# Patient Record
Sex: Female | Born: 1995 | Race: Black or African American | Hispanic: No | Marital: Single | Smoking: Never smoker
Health system: Southern US, Community
[De-identification: ages and names within clinical notes are randomized; demographics above are authoritative.]

## PROBLEM LIST (undated history)

## (undated) DIAGNOSIS — T7840XA Allergy, unspecified, initial encounter: Secondary | ICD-10-CM

---
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
## 2016-08-03 ENCOUNTER — Emergency Department (HOSPITAL_COMMUNITY)
Admission: EM | Admit: 2016-08-03 | Discharge: 2016-08-04 | Disposition: A | Discharge status: Home / Self Care | Payer: PRIVATE HEALTH INSURANCE | Attending: Emergency Medicine | Admitting: Emergency Medicine

## 2016-08-03 ENCOUNTER — Encounter (HOSPITAL_COMMUNITY): Payer: Self-pay

## 2016-08-03 DIAGNOSIS — N390 Urinary tract infection, site not specified: Secondary | ICD-10-CM

## 2016-08-03 DIAGNOSIS — R55 Syncope and collapse: Secondary | ICD-10-CM

## 2016-08-03 LAB — URINALYSIS, ROUTINE W REFLEX MICROSCOPIC
Bilirubin Urine: NEGATIVE
GLUCOSE, UA: NEGATIVE mg/dL
Ketones, ur: NEGATIVE mg/dL
Nitrite: NEGATIVE
PROTEIN: NEGATIVE mg/dL
SPECIFIC GRAVITY, URINE: 1.008 (ref 1.005–1.030)
pH: 6 (ref 5.0–8.0)

## 2016-08-03 LAB — BASIC METABOLIC PANEL
ANION GAP: 9 (ref 5–15)
BUN: 7 mg/dL (ref 6–20)
CHLORIDE: 106 mmol/L (ref 101–111)
CO2: 22 mmol/L (ref 22–32)
Calcium: 9.8 mg/dL (ref 8.9–10.3)
Creatinine, Ser: 0.75 mg/dL (ref 0.44–1.00)
GFR calc non Af Amer: >60 mL/min (ref >60)
Glucose, Bld: 89 mg/dL (ref 65–99)
POTASSIUM: 4.1 mmol/L (ref 3.5–5.1)
SODIUM: 137 mmol/L (ref 135–145)

## 2016-08-03 LAB — CBC
HEMATOCRIT: 44 % (ref 36.0–46.0)
HEMOGLOBIN: 15 g/dL (ref 12.0–15.0)
MCH: 30.5 pg (ref 26.0–34.0)
MCHC: 34.1 g/dL (ref 30.0–36.0)
MCV: 89.6 fL (ref 78.0–100.0)
Platelets: 274 10*3/uL (ref 150–400)
RBC: 4.91 MIL/uL (ref 3.87–5.11)
RDW: 12.4 % (ref 11.5–15.5)
WBC: 10 10*3/uL (ref 4.0–10.5)

## 2016-08-03 LAB — URINE MICROSCOPIC-ADD ON

## 2016-08-03 LAB — POC URINE PREG, ED: PREG TEST UR: NEGATIVE

## 2016-08-03 NOTE — ED Triage Notes (Signed)
Pt states she was standing at work and had acute onset of abdominal pain; pt states she walked to the back and everything went black; Pt states she never passed completely; Pt denies current abdominal pain; pt states she still has appendix; Pt denies any recent illness

## 2016-08-03 NOTE — ED Provider Notes (Signed)
[facility]-EMERGENCY DEPT Provider Note   CSN: [id number] Arrival date & time: 08/03/16  4098  First Provider Contact:  None       History   Chief Complaint Chief Complaint  Patient presents with  . Near Syncope    HPI Katrina Maldonado is a 20 y.o. female.  HPI   20 year old female presents for evaluation of a near syncope. Pt report acute onset lower abd pain and then felt lightheaded and have to sit down.  abd pain has since resolved but pt still having a "swimmy headed" sensation.  This has never happened to her before. Denies cp, sob, heart palpitation.  Denies any changes in medication, recent illness, fever, chills, headache, diplopia, neck pain, cp, sob, back pain, focal numbness or weakness.  No n/v/d, or changes in her bowel/bladder habits.  Report having working more than usual, at least daily and admits she haven't been drinking enough fluid.  Patient also reported being diagnosed with having chlamydia infection 2 weeks ago. She was giving medication in the hospital once this treatment but states that her symptoms has not fully resolved. She denies vaginal bleeding or vaginal discharge and no pain with sexual activities.      History reviewed. No pertinent past medical history.  There are no active problems to display for this patient.   History reviewed. No pertinent surgical history.  OB History    No data available       Home Medications    Prior to Admission medications   Medication Sig Start Date End Date Taking? Authorizing Provider  medroxyPROGESTERone (DEPO-PROVERA) 150 MG/ML injection Inject 150 mg into the muscle every 3 (three) months. Last injection June 14, 2016   Yes Historical Provider, MD    Family History No family history on file.  Social History Social History  Substance Use Topics  . Smoking status: Never Smoker  . Smokeless tobacco: Never Used  . Alcohol use No     Allergies   Review of patient's allergies indicates no known  allergies.   Review of Systems Review of Systems  All other systems reviewed and are negative.    Physical Exam Updated Vital Signs BP 130/77   Pulse 89   Temp 97.7 F (36.5 C) (Oral)   Resp 16   Ht 5' 5.5" (1.664 m)   Wt 61.2 kg   LMP  (LMP Unknown) Comment: pt states she is on BC and does not have mentral  SpO2 100%   BMI 22.12 kg/m   Physical Exam  Constitutional: She is oriented to person, place, and time. She appears well-developed and well-nourished. No distress.  HENT:  Head: Atraumatic.  Eyes: Conjunctivae are normal.  Neck: Neck supple.  No nuchal rigidity  Cardiovascular: Normal rate and regular rhythm.  Exam reveals no gallop and no friction rub.   No murmur heard. Pulmonary/Chest: Effort normal and breath sounds normal.  Abdominal: Soft. Bowel sounds are normal. There is no tenderness. There is no rebound and no guarding.  Musculoskeletal: Normal range of motion.  Neurological: She is alert and oriented to person, place, and time. She displays normal reflexes. No cranial nerve deficit. She exhibits normal muscle tone. Coordination normal.  Skin: Capillary refill takes less than 2 seconds. No rash noted.  Psychiatric: She has a normal mood and affect.  Nursing note and vitals reviewed.    ED Treatments / Results  Labs (all labs ordered are listed, but only abnormal results are displayed) Labs Reviewed  URINALYSIS,  ROUTINE W REFLEX MICROSCOPIC (NOT AT [facility]) - Abnormal; Notable for the following:       Result Value   Hgb urine dipstick SMALL (*)    Leukocytes, UA LARGE (*)    All other components within normal limits  URINE MICROSCOPIC-ADD ON - Abnormal; Notable for the following:    Squamous Epithelial / LPF 6-30 (*)    Bacteria, UA RARE (*)    All other components within normal limits  BASIC METABOLIC PANEL  CBC  CBG MONITORING, ED  POC URINE PREG, ED    EKG  EKG Interpretation None       Radiology No results  found.  Procedures Procedures (including critical care time)  Medications Ordered in ED Medications - No data to display   Initial Impression / Assessment and Plan / ED Course  I have reviewed the triage vital signs and the nursing notes.  Pertinent labs & imaging results that were available during my care of the patient were reviewed by me and considered in my medical decision making (see chart for details).  Clinical Course    BP 116/82   Pulse 89   Temp 97.7 F (36.5 C) (Oral)   Resp 16   Ht 5' 5.5" (1.664 m)   Wt 61.2 kg   LMP  (LMP Unknown) Comment: pt states she is on BC and does not have mentral  SpO2 99%   BMI 22.12 kg/m    Final Clinical Impressions(s) / ED Diagnoses   Final diagnoses:  None    New Prescriptions New Prescriptions   DOXYCYCLINE (VIBRAMYCIN) 100 MG CAPSULE    Take 1 capsule (100 mg total) by mouth 2 (two) times daily. One po bid x 7 days   11:44 PM Patient here for a near syncopal episode earlier today. Her symptoms mostly resolved. He reports abdominal discomfort earlier but that has since resolved. No reproducible abdominal pain on exam. Her labs remarkable for urine with gradual urine tract infection however patient denies any urinary symptoms. She did mention been diagnosed with chlamydia infection several weeks prior and was treated once but the symptoms still persist. She does not want another pelvic examination. Since the pregnancy test is negative, plan to discharge with doxycycline to cover for potentially undertreated Chlamydia.    Fayrene Helper, PA-C 08/04/16 0005    Derwood Kaplan, MD 08/04/16 0623

## 2016-08-04 MED ORDER — DOXYCYCLINE HYCLATE 100 MG PO CAPS: 100.0000 mg | ORAL_CAPSULE | Freq: Two times a day (BID) | ORAL | 0 refills | Status: DC

## 2016-08-04 NOTE — Discharge Instructions (Signed)
Please stay hydrated to decrease risk of passing out.  Take antibiotic as prescribed for the full duration as treatment for suspect under treated chlamydial infection.  Return if you have any concerns.

## 2016-10-30 ENCOUNTER — Encounter (HOSPITAL_COMMUNITY): Payer: Self-pay

## 2016-10-30 ENCOUNTER — Emergency Department (HOSPITAL_COMMUNITY)
Admission: EM | Admit: 2016-10-30 | Discharge: 2016-10-30 | Disposition: A | Discharge status: Home / Self Care | Payer: 59 | Attending: Emergency Medicine | Admitting: Emergency Medicine

## 2016-10-30 DIAGNOSIS — B379 Candidiasis, unspecified: Secondary | ICD-10-CM

## 2016-10-30 DIAGNOSIS — N390 Urinary tract infection, site not specified: Secondary | ICD-10-CM

## 2016-10-30 DIAGNOSIS — R319 Hematuria, unspecified: Secondary | ICD-10-CM

## 2016-10-30 DIAGNOSIS — N898 Other specified noninflammatory disorders of vagina: Secondary | ICD-10-CM | POA: Diagnosis present

## 2016-10-30 LAB — URINALYSIS, ROUTINE W REFLEX MICROSCOPIC
Bilirubin Urine: NEGATIVE
Glucose, UA: NEGATIVE mg/dL
HGB URINE DIPSTICK: NEGATIVE
Ketones, ur: NEGATIVE mg/dL
Nitrite: NEGATIVE
Protein, ur: NEGATIVE mg/dL
SPECIFIC GRAVITY, URINE: 1.026 (ref 1.005–1.030)
pH: 6 (ref 5.0–8.0)

## 2016-10-30 LAB — WET PREP, GENITAL
Clue Cells Wet Prep HPF POC: NONE SEEN
Sperm: NONE SEEN
TRICH WET PREP: NONE SEEN

## 2016-10-30 LAB — URINE MICROSCOPIC-ADD ON

## 2016-10-30 LAB — PREGNANCY, URINE: PREG TEST UR: NEGATIVE

## 2016-10-30 MED ORDER — FLUCONAZOLE 150 MG PO TABS: ORAL_TABLET | ORAL | 0 refills | Status: DC

## 2016-10-30 MED ORDER — CEFTRIAXONE SODIUM 250 MG IJ SOLR
250.0000 mg | Freq: Once | INTRAMUSCULAR | Status: AC
  Administered 2016-10-30: 250 mg via INTRAMUSCULAR
  Filled 2016-10-30: qty 250

## 2016-10-30 MED ORDER — AZITHROMYCIN 250 MG PO TABS
1000.0000 mg | ORAL_TABLET | Freq: Once | ORAL | Status: AC
  Administered 2016-10-30: 1000 mg via ORAL
  Filled 2016-10-30: qty 4

## 2016-10-30 MED ORDER — CEPHALEXIN 500 MG PO CAPS: 500.0000 mg | ORAL_CAPSULE | Freq: Two times a day (BID) | ORAL | 0 refills | Status: DC

## 2016-10-30 MED ORDER — LIDOCAINE HCL 1 % IJ SOLN
INTRAMUSCULAR | Status: AC
  Administered 2016-10-30: 20 mL
  Filled 2016-10-30: qty 20

## 2016-10-30 NOTE — ED Provider Notes (Signed)
[facility]-EMERGENCY DEPT Provider Note   CSN: [id number] Arrival date & time: 10/30/16  1101     History   Chief Complaint Chief Complaint  Patient presents with  . Vaginal Itching    HPI Katrina Maldonado is a 20 y.o. female.  The history is provided by the patient and medical records.     20 year old female here with vaginal discharge. States it has seemed like a "powdery white substance".  Denies any irregular vaginal bleeding.  No abdominal or pelvic pain.  She has had some vaginal itching as well.  No urinary symptoms.  Does report new sexual partner and does have some concern for STD.  States she was diagnosed with chlamydia a few months ago but was treated for this.  No fever, chills.  VSS.  History reviewed. No pertinent past medical history.  There are no active problems to display for this patient.   History reviewed. No pertinent surgical history.  OB History    No data available       Home Medications    Prior to Admission medications   Medication Sig Start Date End Date Taking? Authorizing Provider  medroxyPROGESTERone (DEPO-PROVERA) 150 MG/ML injection Inject 150 mg into the muscle every 3 (three) months. Last injection June 14, 2016   Yes Historical Provider, MD  Multiple Vitamin (MULTIVITAMIN WITH MINERALS) TABS tablet Take 1 tablet by mouth daily.   Yes Historical Provider, MD    Family History No family history on file.  Social History Social History  Substance Use Topics  . Smoking status: Never Smoker  . Smokeless tobacco: Never Used  . Alcohol use No     Allergies   Review of patient's allergies indicates no known allergies.   Review of Systems Review of Systems  Genitourinary: Positive for vaginal discharge.  All other systems reviewed and are negative.    Physical Exam Updated Vital Signs BP 136/80 (BP Location: Right Arm)   Pulse 78   Temp 99 F (37.2 C) (Oral)   Resp 18   Ht 5\' 6"  (1.676 m)   LMP 09/30/2016 (Approximate)    SpO2 98%   Physical Exam  Constitutional: She is oriented to person, place, and time. She appears well-developed and well-nourished.  HENT:  Head: Normocephalic and atraumatic.  Mouth/Throat: Oropharynx is clear and moist.  Eyes: Conjunctivae and EOM are normal. Pupils are equal, round, and reactive to light.  Neck: Normal range of motion.  Cardiovascular: Normal rate, regular rhythm and normal heart sounds.   Pulmonary/Chest: Effort normal and breath sounds normal.  Abdominal: Soft. Bowel sounds are normal.  Genitourinary:  Genitourinary Comments: Exam chaperoned by RN normal female external genitalia without visible lesions or rashes, no appreciable discharge noted; no bleeding or FB; no adnexal or CMT  Musculoskeletal: Normal range of motion.  Neurological: She is alert and oriented to person, place, and time.  Skin: Skin is warm and dry.  Psychiatric: She has a normal mood and affect.  Nursing note and vitals reviewed.    ED Treatments / Results  Labs (all labs ordered are listed, but only abnormal results are displayed) Labs Reviewed - No data to display  EKG  EKG Interpretation None       Radiology No results found.  Procedures Procedures (including critical care time)  Medications Ordered in ED Medications - No data to display   Initial Impression / Assessment and Plan / ED Course  I have reviewed the triage vital signs and the nursing notes.  Pertinent labs & imaging results that were available during my care of the patient were reviewed by me and considered in my medical decision making (see chart for details).  Clinical Course   20 year old female here with vaginal discharge and itching. Does report new sexual partner with concern for STD. She is afebrile and nontoxic. Abdominal exam is benign. Pelvic exam with no appreciable discharge. No adnexal or cervical motion tenderness. Wet prep does reveal yeast and a few PVCs. UA does appear infectious. Given  her concern for STD, she was treated empirically with Rocephin and azithromycin here in ED.  Gc/Chl. HIV, and RPR pending. Will discharge home with Keflex and Diflucan. She was encouraged to follow-up with women's clinic for any ongoing oncologic issues.  Discussed plan with patient, she acknowledged understanding and agreed with plan of care.  Return precautions given for new or worsening symptoms.  Final Clinical Impressions(s) / ED Diagnoses   Final diagnoses:  Urinary tract infection with hematuria, site unspecified  Yeast infection    New Prescriptions New Prescriptions   CEPHALEXIN (KEFLEX) 500 MG CAPSULE    Take 1 capsule (500 mg total) by mouth 2 (two) times daily.   FLUCONAZOLE (DIFLUCAN) 150 MG TABLET    Take one pill now.  Repeat in 72 hours if needed for vaginal itching.     Garlon HatchetLisa M Melville Engen, PA-C 10/30/16 1711    Jacalyn LefevreJulie Haviland, MD 10/30/16 [phone]

## 2016-10-30 NOTE — ED Triage Notes (Signed)
Pt presents with c/o vaginal itching. Pt reports that she has also noticed a bump on her vagina and she has been exposed to STD's.

## 2016-10-30 NOTE — Discharge Instructions (Signed)
Take the prescribed medication as directed. Follow-up with women's clinic if you continue having any vaginal issues. Return to the ED for new or worsening symptoms.

## 2016-10-31 LAB — HIV ANTIBODY (ROUTINE TESTING W REFLEX): HIV Screen 4th Generation wRfx: NONREACTIVE

## 2016-10-31 LAB — GC/CHLAMYDIA PROBE AMP (~~LOC~~) NOT AT ARMC
CHLAMYDIA, DNA PROBE: NEGATIVE
Neisseria Gonorrhea: NEGATIVE

## 2016-10-31 LAB — RPR: RPR: NONREACTIVE

## 2017-09-20 ENCOUNTER — Encounter (HOSPITAL_COMMUNITY): Payer: Self-pay | Admitting: Emergency Medicine

## 2017-09-20 DIAGNOSIS — R1031 Right lower quadrant pain: Secondary | ICD-10-CM | POA: Insufficient documentation

## 2017-09-20 NOTE — ED Triage Notes (Signed)
Pt comes in with complaints of abdominal pain that began today. Pt eating and drinking regularly without any issues. Denies vomiting and diarrhea. Does not have periods due to having Nexplanon implant.

## 2017-09-21 ENCOUNTER — Emergency Department (HOSPITAL_COMMUNITY): Payer: 59

## 2017-09-21 ENCOUNTER — Encounter (HOSPITAL_COMMUNITY): Payer: Self-pay | Admitting: Radiology

## 2017-09-21 ENCOUNTER — Emergency Department (HOSPITAL_COMMUNITY)
Admission: EM | Admit: 2017-09-21 | Discharge: 2017-09-21 | Disposition: A | Discharge status: Home / Self Care | Payer: 59 | Attending: Emergency Medicine | Admitting: Emergency Medicine

## 2017-09-21 DIAGNOSIS — R1031 Right lower quadrant pain: Secondary | ICD-10-CM

## 2017-09-21 LAB — URINALYSIS, ROUTINE W REFLEX MICROSCOPIC
BILIRUBIN URINE: NEGATIVE
Glucose, UA: NEGATIVE mg/dL
KETONES UR: 20 mg/dL — AB
NITRITE: NEGATIVE
PH: 5 (ref 5.0–8.0)
PROTEIN: NEGATIVE mg/dL
SPECIFIC GRAVITY, URINE: 1.043 — AB (ref 1.005–1.030)

## 2017-09-21 LAB — CBC
HEMATOCRIT: 43.4 % (ref 36.0–46.0)
Hemoglobin: 15 g/dL (ref 12.0–15.0)
MCH: 31.6 pg (ref 26.0–34.0)
MCHC: 34.6 g/dL (ref 30.0–36.0)
MCV: 91.4 fL (ref 78.0–100.0)
PLATELETS: 290 10*3/uL (ref 150–400)
RBC: 4.75 MIL/uL (ref 3.87–5.11)
RDW: 12.4 % (ref 11.5–15.5)
WBC: 9.8 10*3/uL (ref 4.0–10.5)

## 2017-09-21 LAB — COMPREHENSIVE METABOLIC PANEL
ALT: 14 U/L (ref 14–54)
AST: 20 U/L (ref 15–41)
Albumin: 4.9 g/dL (ref 3.5–5.0)
Alkaline Phosphatase: 75 U/L (ref 38–126)
Anion gap: 10 (ref 5–15)
BILIRUBIN TOTAL: 0.9 mg/dL (ref 0.3–1.2)
BUN: 9 mg/dL (ref 6–20)
CHLORIDE: 105 mmol/L (ref 101–111)
CO2: 26 mmol/L (ref 22–32)
Calcium: 9.5 mg/dL (ref 8.9–10.3)
Creatinine, Ser: 0.85 mg/dL (ref 0.44–1.00)
Glucose, Bld: 90 mg/dL (ref 65–99)
POTASSIUM: 3.7 mmol/L (ref 3.5–5.1)
Sodium: 141 mmol/L (ref 135–145)
TOTAL PROTEIN: 8 g/dL (ref 6.5–8.1)

## 2017-09-21 LAB — I-STAT BETA HCG BLOOD, ED (MC, WL, AP ONLY): I-stat hCG, quantitative: <5 m[IU]/mL (ref <5)

## 2017-09-21 LAB — LIPASE, BLOOD: LIPASE: 24 U/L (ref 11–51)

## 2017-09-21 MED ORDER — IOPAMIDOL (ISOVUE-300) INJECTION 61%
100.0000 mL | Freq: Once | INTRAVENOUS | Status: AC | PRN
  Administered 2017-09-21: 100 mL via INTRAVENOUS

## 2017-09-21 MED ORDER — CEPHALEXIN 500 MG PO CAPS: 500.0000 mg | ORAL_CAPSULE | Freq: Four times a day (QID) | ORAL | 0 refills | Status: DC

## 2017-09-21 MED ORDER — IOPAMIDOL (ISOVUE-300) INJECTION 61%
INTRAVENOUS | Status: AC
  Filled 2017-09-21: qty 100

## 2017-09-21 NOTE — ED Provider Notes (Signed)
Pt with additional questions after previous provider spoke with her and she was discharged. Questioning why bacteria noted on UA, but not treated. Discussed that squamous cells also noted and that cannot discriminate bacteria from UTI versus contamination on testing, but that contamination probably more likely. She denies specific urinary complaints to me as well. I recommended obtaining a urine culture and treating pending those results. She is pushing for abx. It's plausible her pain could be from UTI and there isn't a clear alternative explanation. I don't think abx are completely unreasonable. Script for keflex provided.   Results for orders placed or performed during the hospital encounter of 09/21/17  Lipase, blood  Result Value Ref Range   Lipase 24 11 - 51 U/L  Comprehensive metabolic panel  Result Value Ref Range   Sodium 141 135 - 145 mmol/L   Potassium 3.7 3.5 - 5.1 mmol/L   Chloride 105 101 - 111 mmol/L   CO2 26 22 - 32 mmol/L   Glucose, Bld 90 65 - 99 mg/dL   BUN 9 6 - 20 mg/dL   Creatinine, Ser 1.61 0.44 - 1.00 mg/dL   Calcium 9.5 8.9 - 09.6 mg/dL   Total Protein 8.0 6.5 - 8.1 g/dL   Albumin 4.9 3.5 - 5.0 g/dL   AST 20 15 - 41 U/L   ALT 14 14 - 54 U/L   Alkaline Phosphatase 75 38 - 126 U/L   Total Bilirubin 0.9 0.3 - 1.2 mg/dL   GFR calc non Af Amer >60 >60 mL/min   GFR calc Af Amer >60 >60 mL/min   Anion gap 10 5 - 15  CBC  Result Value Ref Range   WBC 9.8 4.0 - 10.5 K/uL   RBC 4.75 3.87 - 5.11 MIL/uL   Hemoglobin 15.0 12.0 - 15.0 g/dL   HCT 04.5 40.9 - 81.1 %   MCV 91.4 78.0 - 100.0 fL   MCH 31.6 26.0 - 34.0 pg   MCHC 34.6 30.0 - 36.0 g/dL   RDW 91.4 78.2 - 95.6 %   Platelets 290 150 - 400 K/uL  Urinalysis, Routine w reflex microscopic  Result Value Ref Range   Color, Urine YELLOW YELLOW   APPearance HAZY (A) CLEAR   Specific Gravity, Urine 1.043 (H) 1.005 - 1.030   pH 5.0 5.0 - 8.0   Glucose, UA NEGATIVE NEGATIVE mg/dL   Hgb urine dipstick SMALL (A)  NEGATIVE   Bilirubin Urine NEGATIVE NEGATIVE   Ketones, ur 20 (A) NEGATIVE mg/dL   Protein, ur NEGATIVE NEGATIVE mg/dL   Nitrite NEGATIVE NEGATIVE   Leukocytes, UA LARGE (A) NEGATIVE   RBC / HPF 0-5 0 - 5 RBC/hpf   WBC, UA 6-30 0 - 5 WBC/hpf   Bacteria, UA RARE (A) NONE SEEN   Squamous Epithelial / LPF 6-30 (A) NONE SEEN   Mucus PRESENT   I-Stat beta hCG blood, ED  Result Value Ref Range   I-stat hCG, quantitative <5.0 <5 mIU/mL   Comment 3           Ct Abdomen Pelvis W Contrast  Result Date: 09/21/2017 CLINICAL DATA:  Abdominal pain beginning today. EXAM: CT ABDOMEN AND PELVIS WITH CONTRAST TECHNIQUE: Multidetector CT imaging of the abdomen and pelvis was performed using the standard protocol following bolus administration of intravenous contrast. CONTRAST:  ISOVUE-300 IOPAMIDOL (ISOVUE-300) INJECTION 61% COMPARISON:  None. FINDINGS: Lower chest: Lung bases are clear. Hepatobiliary: Mild diffuse fatty infiltration of the liver. No focal liver lesions. Gallbladder and bile  ducts are unremarkable. Pancreas: Unremarkable. No pancreatic ductal dilatation or surrounding inflammatory changes. Spleen: Normal in size without focal abnormality. Adrenals/Urinary Tract: Adrenal glands are unremarkable. Kidneys are normal, without renal calculi, focal lesion, or hydronephrosis. Bladder is unremarkable. Stomach/Bowel: Stomach is decompressed. Now rotation of the small bowel with ligament of Treitz to the right of midline and proximal small bowel in the right mid abdomen. No evidence of obstruction or wall thickening. Positioning of the colon is normal. Colon is mostly decompressed with scattered gas and stool present. No colonic wall thickening or inflammation. Appendix is normal. Vascular/Lymphatic: No significant vascular findings are present. No enlarged abdominal or pelvic lymph nodes. Reproductive: Uterus and bilateral adnexa are unremarkable. Other: No free air or free fluid in the abdomen.  Abdominal wall musculature appears intact. Musculoskeletal: No acute or significant osseous findings. IMPRESSION: 1. 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 No evidence of obstruction. Normal appearance of the colon. 2. Mild diffuse fatty infiltration of the liver. Electronically Signed   By: Burman Nieves M.D.   On: 09/21/2017 06:41      Raeford Razor, MD 09/21/17 [phone]

## 2017-09-21 NOTE — ED Provider Notes (Signed)
[facility]-EMERGENCY DEPT Provider Note   CSN: [id number] Arrival date & time: 09/20/17  2248     History   Chief Complaint Chief Complaint  Patient presents with  . Abdominal Pain    HPI Katrina Maldonado is a 21 y.o. female.  Patient c/o rlq abd pain in the past day. Pain constant, dull, moderate, non radiating. Denies dysuria. No vaginal bleeding or discharge, states had some vaginal bleeding 2-3 days ago. Denies back or flank pain. Denies hx ovarian cysts, or endometriosis. No prior abd surgery/no hx appendicitis.  Nausea. No vomiting. No diarrhea, having normal bms.    The history is provided by the patient.  Abdominal Pain   Pertinent negatives include fever, vomiting, dysuria and headaches.    History reviewed. No pertinent past medical history.  There are no active problems to display for this patient.   History reviewed. No pertinent surgical history.  OB History    No data available       Home Medications    Prior to Admission medications   Not on File    Family History No family history on file.  Social History Social History  Substance Use Topics  . Smoking status: Never Smoker  . Smokeless tobacco: Never Used  . Alcohol use No     Allergies   Patient has no known allergies.   Review of Systems Review of Systems  Constitutional: Negative for fever.  HENT: Negative for sore throat.   Eyes: Negative for redness.  Respiratory: Negative for cough and shortness of breath.   Cardiovascular: Negative for chest pain.  Gastrointestinal: Positive for abdominal pain. Negative for vomiting.  Genitourinary: Negative for dysuria, flank pain, vaginal bleeding and vaginal discharge.  Musculoskeletal: Negative for back pain.  Skin: Negative for rash.  Neurological: Negative for headaches.  Hematological: Does not bruise/bleed easily.  Psychiatric/Behavioral: Negative for confusion.     Physical Exam Updated Vital Signs BP (!) 154/99 (BP Location:  Right Arm)   Pulse 86   Temp 98.4 F (36.9 C) (Oral)   Resp 18   Wt 62.6 kg (138 lb)   SpO2 97%   BMI 22.27 kg/m   Physical Exam  Constitutional: She appears well-developed and well-nourished. No distress.  HENT:  Head: Atraumatic.  Eyes: Conjunctivae are normal. No scleral icterus.  Neck: Neck supple. No tracheal deviation present.  Cardiovascular: Normal rate.   Pulmonary/Chest: Effort normal and breath sounds normal. No respiratory distress.  Abdominal: Soft. Normal appearance and bowel sounds are normal. She exhibits no distension. There is tenderness.  RLQ tenderness  Genitourinary:  Genitourinary Comments: No cva tenderness  Musculoskeletal: She exhibits no edema.  Neurological: She is alert.  Skin: Skin is warm and dry. No rash noted. She is not diaphoretic.  Psychiatric: She has a normal mood and affect.  Nursing note and vitals reviewed.    ED Treatments / Results  Labs (all labs ordered are listed, but only abnormal results are displayed) Results for orders placed or performed during the hospital encounter of 09/21/17  Lipase, blood  Result Value Ref Range   Lipase 24 11 - 51 U/L  Comprehensive metabolic panel  Result Value Ref Range   Sodium 141 135 - 145 mmol/L   Potassium 3.7 3.5 - 5.1 mmol/L   Chloride 105 101 - 111 mmol/L   CO2 26 22 - 32 mmol/L   Glucose, Bld 90 65 - 99 mg/dL   BUN 9 6 - 20 mg/dL   Creatinine, Ser 4.09 0.44 -  1.00 mg/dL   Calcium 9.5 8.9 - 45.4 mg/dL   Total Protein 8.0 6.5 - 8.1 g/dL   Albumin 4.9 3.5 - 5.0 g/dL   AST 20 15 - 41 U/L   ALT 14 14 - 54 U/L   Alkaline Phosphatase 75 38 - 126 U/L   Total Bilirubin 0.9 0.3 - 1.2 mg/dL   GFR calc non Af Amer >60 >60 mL/min   GFR calc Af Amer >60 >60 mL/min   Anion gap 10 5 - 15  CBC  Result Value Ref Range   WBC 9.8 4.0 - 10.5 K/uL   RBC 4.75 3.87 - 5.11 MIL/uL   Hemoglobin 15.0 12.0 - 15.0 g/dL   HCT 09.8 11.9 - 14.7 %   MCV 91.4 78.0 - 100.0 fL   MCH 31.6 26.0 - 34.0 pg    MCHC 34.6 30.0 - 36.0 g/dL   RDW 82.9 56.2 - 13.0 %   Platelets 290 150 - 400 K/uL  I-Stat beta hCG blood, ED  Result Value Ref Range   I-stat hCG, quantitative <5.0 <5 mIU/mL   Comment 3            EKG  EKG Interpretation None       Radiology Ct Abdomen Pelvis W Contrast  Result Date: 09/21/2017 CLINICAL DATA:  Abdominal pain beginning today. EXAM: CT ABDOMEN AND PELVIS WITH CONTRAST TECHNIQUE: Multidetector CT imaging of the abdomen and pelvis was performed using the standard protocol following bolus administration of intravenous contrast. CONTRAST:  ISOVUE-300 IOPAMIDOL (ISOVUE-300) INJECTION 61% COMPARISON:  None. FINDINGS: Lower chest: Lung bases are clear. Hepatobiliary: Mild diffuse fatty infiltration of the liver. No focal liver lesions. Gallbladder and bile ducts are unremarkable. Pancreas: Unremarkable. No pancreatic ductal dilatation or surrounding inflammatory changes. Spleen: Normal in size without focal abnormality. Adrenals/Urinary Tract: Adrenal glands are unremarkable. Kidneys are normal, without renal calculi, focal lesion, or hydronephrosis. Bladder is unremarkable. Stomach/Bowel: Stomach is decompressed. Now rotation of the small bowel with ligament of Treitz to the right of midline and proximal small bowel in the right mid abdomen. No evidence of obstruction or wall thickening. Positioning of the colon is normal. Colon is mostly decompressed with scattered gas and stool present. No colonic wall thickening or inflammation. Appendix is normal. Vascular/Lymphatic: No significant vascular findings are present. No enlarged abdominal or pelvic lymph nodes. Reproductive: Uterus and bilateral adnexa are unremarkable. Other: No free air or free fluid in the abdomen. Abdominal wall musculature appears intact. Musculoskeletal: No acute or significant osseous findings. IMPRESSION: 1. 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 No evidence of  obstruction. Normal appearance of the colon. 2. Mild diffuse fatty infiltration of the liver. Electronically Signed   By: Burman Nieves M.D.   On: 09/21/2017 06:41    Procedures Procedures (including critical care time)  Medications Ordered in ED Medications - No data to display   Initial Impression / Assessment and Plan / ED Course  I have reviewed the triage vital signs and the nursing notes.  Pertinent labs & imaging results that were available during my care of the patient were reviewed by me and considered in my medical decision making (see chart for details).  Iv ns. Labs.   Reviewed nursing notes and prior charts for additional history.   rlq tenderness, will get ct.   Recheck pt, comfortable appearing, nad.   No vomiting. Afeb.    Discussed incidental finding malrotation w pt, will refer to  gen surg f/u as outpt.   Return precautions provided.     Final Clinical Impressions(s) / ED Diagnoses   Final diagnoses:  None    New Prescriptions New Prescriptions   No medications on file     Cathren Laine, MD 09/21/17 [phone]

## 2017-09-21 NOTE — Discharge Instructions (Addendum)
It was our pleasure to provide your ER care today - we hope that you feel better.  Your CT scan was read as showing 'malrotation of the small intestine'.  For this, follow up with general surgeon in their office in the 1-2 weeks - call office this Monday to arrange appointment.  Return to ER right away if worse, new symptoms, high fevers, worsening or severe pain, persistent vomiting, other concern.

## 2017-09-23 LAB — URINE CULTURE

## 2017-10-10 ENCOUNTER — Other Ambulatory Visit: Payer: Self-pay | Admitting: Surgery

## 2017-10-10 DIAGNOSIS — Q433 Congenital malformations of intestinal fixation: Secondary | ICD-10-CM

## 2017-10-17 ENCOUNTER — Ambulatory Visit
Admission: RE | Admit: 2017-10-17 | Discharge: 2017-10-17 | Disposition: A | Discharge status: Home / Self Care | Payer: 59 | Source: Ambulatory Visit | Attending: Surgery | Admitting: Surgery

## 2017-10-17 DIAGNOSIS — Q433 Congenital malformations of intestinal fixation: Secondary | ICD-10-CM

## 2017-11-27 ENCOUNTER — Other Ambulatory Visit: Payer: Self-pay

## 2017-11-27 ENCOUNTER — Encounter (HOSPITAL_BASED_OUTPATIENT_CLINIC_OR_DEPARTMENT_OTHER): Payer: Self-pay | Admitting: *Deleted

## 2017-11-27 ENCOUNTER — Emergency Department (HOSPITAL_BASED_OUTPATIENT_CLINIC_OR_DEPARTMENT_OTHER)
Admission: EM | Admit: 2017-11-27 | Discharge: 2017-11-27 | Disposition: A | Discharge status: Home / Self Care | Payer: 59 | Attending: Emergency Medicine | Admitting: Emergency Medicine

## 2017-11-27 DIAGNOSIS — R51 Headache: Secondary | ICD-10-CM | POA: Diagnosis not present

## 2017-11-27 DIAGNOSIS — R0981 Nasal congestion: Secondary | ICD-10-CM | POA: Insufficient documentation

## 2017-11-27 MED ORDER — TRIAMCINOLONE ACETONIDE 55 MCG/ACT NA AERO: 2.0000 | INHALATION_SPRAY | Freq: Every day | NASAL | 0 refills | Status: DC

## 2017-11-27 MED ORDER — CETIRIZINE HCL 10 MG PO TABS: 10.0000 mg | ORAL_TABLET | Freq: Every day | ORAL | 0 refills | Status: DC

## 2017-11-27 NOTE — ED Notes (Signed)
ED Provider at bedside. 

## 2017-11-27 NOTE — ED Triage Notes (Signed)
Pt c/o URi symptoms  x 3 weeks  

## 2017-11-27 NOTE — ED Provider Notes (Signed)
[facility] EMERGENCY DEPARTMENT Provider Note   CSN: [id number] Arrival date & time: 11/27/17  1651     History   Chief Complaint Chief Complaint  Patient presents with  . Nasal Congestion    HPI Katrina Maldonado is a 21 y.o. female.  HPI  21 year old female presents with nasal congestion.  This is been ongoing for 3 weeks.  It started off as a scratchy throat and sore throat but this has since resolved.  She has not had any fevers or trouble breathing.  She did have a cough momentarily but that has been resolved.  On and off headaches.  She is tried Benadryl, DayQuil and NyQuil with no relief.  She feels like her sinuses are stuffed up.  She denies any sinus or facial pain.  She denies any rhinorrhea but has had some yellow nasal congestion.  No vomiting.  History reviewed. No pertinent past medical history.  There are no active problems to display for this patient.   History reviewed. No pertinent surgical history.  OB History    No data available       Home Medications    Prior to Admission medications   Medication Sig Start Date End Date Taking? Authorizing Provider  cetirizine (ZYRTEC ALLERGY) 10 MG tablet Take 1 tablet (10 mg total) by mouth daily. 11/27/17   Pricilla LovelessGoldston, Hari Casaus, MD  triamcinolone (NASACORT) 55 MCG/ACT AERO nasal inhaler Place 2 sprays into the nose daily. 11/27/17   Pricilla LovelessGoldston, Makinley Muscato, MD    Family History History reviewed. No pertinent family history.  Social History Social History   Tobacco Use  . Smoking status: Never Smoker  . Smokeless tobacco: Never Used  Substance Use Topics  . Alcohol use: No  . Drug use: No     Allergies   Patient has no known allergies.   Review of Systems Review of Systems  HENT: Positive for congestion and sore throat. Negative for ear pain, rhinorrhea and sinus pain.   Respiratory: Negative for cough and shortness of breath.   Gastrointestinal: Negative for vomiting.  All other systems  reviewed and are negative.    Physical Exam Updated Vital Signs BP (!) 144/89 (BP Location: Left Arm)   Pulse 93   Temp 98.1 F (36.7 C) (Oral)   Resp 16   Ht 5\' 6"  (1.676 m)   Wt 60.3 kg (133 lb)   SpO2 99%   BMI 21.47 kg/m   Physical Exam  Constitutional: She is oriented to person, place, and time. She appears well-developed and well-nourished.  HENT:  Head: Normocephalic and atraumatic.  Right Ear: External ear normal.  Left Ear: External ear normal.  Nose: Nose normal. No rhinorrhea. Right sinus exhibits no maxillary sinus tenderness and no frontal sinus tenderness. Left sinus exhibits no maxillary sinus tenderness and no frontal sinus tenderness.  Mouth/Throat: Oropharynx is clear and moist. No oropharyngeal exudate.  Eyes: Right eye exhibits no discharge. Left eye exhibits no discharge.  Cardiovascular: Normal rate, regular rhythm and normal heart sounds.  Pulmonary/Chest: Effort normal and breath sounds normal.  Abdominal: Soft. There is no tenderness.  Neurological: She is alert and oriented to person, place, and time.  Skin: Skin is warm and dry.  Nursing note and vitals reviewed.    ED Treatments / Results  Labs (all labs ordered are listed, but only abnormal results are displayed) Labs Reviewed - No data to display  EKG  EKG Interpretation None       Radiology No results  found.  Procedures Procedures (including critical care time)  Medications Ordered in ED Medications - No data to display   Initial Impression / Assessment and Plan / ED Course  I have reviewed the triage vital signs and the nursing notes.  Pertinent labs & imaging results that were available during my care of the patient were reviewed by me and considered in my medical decision making (see chart for details).     Patient has a chief complaint of congestion.  She has no sinus tenderness or pain that would suggest sinusitis.  She is afebrile and has been.  She will be treated  for possible allergic causes.  Follow-up with PCP.  Otherwise is quite well-appearing and does not appear to have an acute bacterial infection.  Return precautions.  Final Clinical Impressions(s) / ED Diagnoses   Final diagnoses:  Nasal congestion    ED Discharge Orders        Ordered    cetirizine (ZYRTEC ALLERGY) 10 MG tablet  Daily     11/27/17 1721    triamcinolone (NASACORT) 55 MCG/ACT AERO nasal inhaler  Daily     11/27/17 1721       Pricilla LovelessGoldston, Phebe Dettmer, MD 11/27/17 1729

## 2018-03-10 ENCOUNTER — Encounter (HOSPITAL_BASED_OUTPATIENT_CLINIC_OR_DEPARTMENT_OTHER): Payer: Self-pay | Admitting: *Deleted

## 2018-03-10 ENCOUNTER — Other Ambulatory Visit: Payer: Self-pay

## 2018-03-10 ENCOUNTER — Emergency Department (HOSPITAL_BASED_OUTPATIENT_CLINIC_OR_DEPARTMENT_OTHER)
Admission: EM | Admit: 2018-03-10 | Discharge: 2018-03-10 | Disposition: A | Discharge status: Home / Self Care | Payer: 59 | Attending: Emergency Medicine | Admitting: Emergency Medicine

## 2018-03-10 DIAGNOSIS — Z79899 Other long term (current) drug therapy: Secondary | ICD-10-CM | POA: Insufficient documentation

## 2018-03-10 DIAGNOSIS — K0889 Other specified disorders of teeth and supporting structures: Secondary | ICD-10-CM | POA: Diagnosis present

## 2018-03-10 DIAGNOSIS — K029 Dental caries, unspecified: Secondary | ICD-10-CM | POA: Insufficient documentation

## 2018-03-10 MED ORDER — TRAMADOL HCL 50 MG PO TABS: 50.0000 mg | ORAL_TABLET | Freq: Four times a day (QID) | ORAL | 0 refills | Status: DC | PRN

## 2018-03-10 MED ORDER — AMOXICILLIN 500 MG PO CAPS: 500.0000 mg | ORAL_CAPSULE | Freq: Three times a day (TID) | ORAL | 0 refills | Status: DC

## 2018-03-10 NOTE — ED Provider Notes (Signed)
[facility] EMERGENCY DEPARTMENT Provider Note   CSN: [id number] Arrival date & time: 03/10/18  2007     History   Chief Complaint Chief Complaint  Patient presents with  . Dental Pain    HPI Katrina Maldonado is a 22 y.o. female.  Patient is a 22 year old female who presents with a 3-day history of worsening pain to her left lower back tooth.  She feels like it is a little swollen.  She denies any fevers.  No nausea or vomiting.  No drainage.  No difficulty swallowing or breathing.  She denies any previous issues with that tooth.  She does not have a dentist.  She is been using over-the-counter medications without improvement in symptoms.      History reviewed. No pertinent past medical history.  There are no active problems to display for this patient.   History reviewed. No pertinent surgical history.  OB History    No data available       Home Medications    Prior to Admission medications   Medication Sig Start Date End Date Taking? Authorizing Provider  amoxicillin (AMOXIL) 500 MG capsule Take 1 capsule (500 mg total) by mouth 3 (three) times daily. 03/10/18   Rolan Bucco, MD  cetirizine (ZYRTEC ALLERGY) 10 MG tablet Take 1 tablet (10 mg total) by mouth daily. 11/27/17   Pricilla Loveless, MD  traMADol (ULTRAM) 50 MG tablet Take 1 tablet (50 mg total) by mouth every 6 (six) hours as needed. 03/10/18   Rolan Bucco, MD  triamcinolone (NASACORT) 55 MCG/ACT AERO nasal inhaler Place 2 sprays into the nose daily. 11/27/17   Pricilla Loveless, MD    Family History No family history on file.  Social History Social History   Tobacco Use  . Smoking status: Never Smoker  . Smokeless tobacco: Never Used  Substance Use Topics  . Alcohol use: No  . Drug use: No     Allergies   Patient has no known allergies.   Review of Systems Review of Systems  Constitutional: Negative for fatigue and fever.  HENT: Positive for dental problem. Negative for  facial swelling.   Respiratory: Negative for shortness of breath.   Gastrointestinal: Negative for nausea and vomiting.  Skin: Negative for color change and wound.  Neurological: Negative for headaches.     Physical Exam Updated Vital Signs BP 139/90 (BP Location: Left Arm)   Pulse 80   Temp 98.9 F (37.2 C) (Oral)   Resp 20   Ht 5\' 6"  (1.676 m)   Wt 64 kg (141 lb)   SpO2 100%   BMI 22.76 kg/m   Physical Exam  Constitutional: She is oriented to person, place, and time. She appears well-developed and well-nourished.  HENT:  Patient has tenderness to the left lower back molar.  There is no abscess.  No swelling.  No trismus.  Uvula is midline.  Neck: Normal range of motion. Neck supple.  Cardiovascular: Normal rate.  Pulmonary/Chest: Effort normal.  Neurological: She is alert and oriented to person, place, and time.  Skin: Skin is warm and dry.     ED Treatments / Results  Labs (all labs ordered are listed, but only abnormal results are displayed) Labs Reviewed - No data to display  EKG  EKG Interpretation None       Radiology No results found.  Procedures Procedures (including critical care time)  Medications Ordered in ED Medications - No data to display   Initial Impression / Assessment and  Plan / ED Course  I have reviewed the triage vital signs and the nursing notes.  Pertinent labs & imaging results that were available during my care of the patient were reviewed by me and considered in my medical decision making (see chart for details).     Patient presents with a toothache.  There is no evidence of a periapical abscess.  She was started on amoxicillin and tramadol for pain.  She was given a list of resources for possible dental follow-up.  Return precautions were given.  Final Clinical Impressions(s) / ED Diagnoses   Final diagnoses:  Dental caries    ED Discharge Orders        Ordered    amoxicillin (AMOXIL) 500 MG capsule  3 times daily      03/10/18 2315    traMADol (ULTRAM) 50 MG tablet  Every 6 hours PRN     03/10/18 2315       Rolan BuccoBelfi, Anielle Headrick, MD 03/10/18 2318

## 2018-03-10 NOTE — ED Triage Notes (Signed)
Dental pain x one year.

## 2018-05-03 ENCOUNTER — Emergency Department (HOSPITAL_BASED_OUTPATIENT_CLINIC_OR_DEPARTMENT_OTHER)
Admission: EM | Admit: 2018-05-03 | Discharge: 2018-05-04 | Disposition: A | Discharge status: Home / Self Care | Payer: 59 | Attending: Emergency Medicine | Admitting: Emergency Medicine

## 2018-05-03 ENCOUNTER — Encounter (HOSPITAL_BASED_OUTPATIENT_CLINIC_OR_DEPARTMENT_OTHER): Payer: Self-pay | Admitting: *Deleted

## 2018-05-03 ENCOUNTER — Other Ambulatory Visit: Payer: Self-pay

## 2018-05-03 DIAGNOSIS — R070 Pain in throat: Secondary | ICD-10-CM | POA: Diagnosis not present

## 2018-05-03 DIAGNOSIS — R0982 Postnasal drip: Secondary | ICD-10-CM | POA: Insufficient documentation

## 2018-05-03 DIAGNOSIS — Z79899 Other long term (current) drug therapy: Secondary | ICD-10-CM | POA: Insufficient documentation

## 2018-05-03 DIAGNOSIS — J029 Acute pharyngitis, unspecified: Secondary | ICD-10-CM

## 2018-05-03 LAB — RAPID STREP SCREEN (MED CTR MEBANE ONLY): Streptococcus, Group A Screen (Direct): NEGATIVE

## 2018-05-03 NOTE — ED Triage Notes (Signed)
Pt c/o sore throat "on one side" and post nasal drip x 1 week

## 2018-05-04 MED ORDER — NAPROXEN 500 MG PO TABS: 500.0000 mg | ORAL_TABLET | Freq: Two times a day (BID) | ORAL | 0 refills | Status: DC

## 2018-05-04 MED ORDER — FLUTICASONE PROPIONATE 50 MCG/ACT NA SUSP: 2.0000 | Freq: Every day | NASAL | 0 refills | Status: DC

## 2018-05-04 MED ORDER — SALINE SPRAY 0.65 % NA SOLN: 1.0000 | NASAL | 0 refills | Status: DC | PRN

## 2018-05-04 NOTE — ED Provider Notes (Signed)
[facility] EMERGENCY DEPARTMENT Provider Note   CSN: [id number] Arrival date & time: 05/03/18  1954     History   Chief Complaint Chief Complaint  Patient presents with  . Sore Throat    HPI Katrina Maldonado is a 22 y.o. female.  HPI  This is a 22 year old female who presents with sore throat.  Patient reports 1 week history of "something wrong with my tonsil."  She reports right-sided sore throat and discomfort.  She states that when she swallows or moves her mouth a certain way she can feel pain in her right side of her throat.  She denies any recent fevers or illnesses.  She does report some postnasal drip and allergies.  She has taken allergy medication with minimal relief.  She has not taken any pain medication.  She states she thinks it has gotten "somewhat better" but wanted to get it checked out.  Denies any cough, nausea, vomiting, abdominal pain.  History reviewed. No pertinent past medical history.  There are no active problems to display for this patient.   History reviewed. No pertinent surgical history.   OB History   None      Home Medications    Prior to Admission medications   Medication Sig Start Date End Date Taking? Authorizing Provider  vitamin C (ASCORBIC ACID) 500 MG tablet Take 500 mg by mouth daily.   Yes [provider]  amoxicillin (AMOXIL) 500 MG capsule Take 1 capsule (500 mg total) by mouth 3 (three) times daily. 03/10/18   Rolan Bucco, MD  cetirizine (ZYRTEC ALLERGY) 10 MG tablet Take 1 tablet (10 mg total) by mouth daily. 11/27/17   Pricilla Loveless, MD  fluticasone (FLONASE) 50 MCG/ACT nasal spray Place 2 sprays into both nostrils daily. 05/04/18   Avaeh Ewer, Mayer Masker, MD  naproxen (NAPROSYN) 500 MG tablet Take 1 tablet (500 mg total) by mouth 2 (two) times daily. 05/04/18   Burwell Bethel, Mayer Masker, MD  sodium chloride (OCEAN) 0.65 % SOLN nasal spray Place 1 spray into both nostrils as needed for congestion. 05/04/18   Shaketta Rill,  Mayer Masker, MD  traMADol (ULTRAM) 50 MG tablet Take 1 tablet (50 mg total) by mouth every 6 (six) hours as needed. 03/10/18   Rolan Bucco, MD  triamcinolone (NASACORT) 55 MCG/ACT AERO nasal inhaler Place 2 sprays into the nose daily. 11/27/17   Pricilla Loveless, MD    Family History No family history on file.  Social History Social History   Tobacco Use  . Smoking status: Never Smoker  . Smokeless tobacco: Never Used  Substance Use Topics  . Alcohol use: No  . Drug use: No     Allergies   Patient has no known allergies.   Review of Systems Review of Systems  Constitutional: Negative for fever.  HENT: Positive for congestion, rhinorrhea and sore throat. Negative for trouble swallowing.   Respiratory: Negative for cough and shortness of breath.   Cardiovascular: Negative for chest pain.  Gastrointestinal: Negative for nausea and vomiting.  All other systems reviewed and are negative.    Physical Exam Updated Vital Signs BP 125/88   Pulse 64   Temp 99 F (37.2 C) (Oral)   Resp 16   Ht  (1.676 m)   Wt 68 kg (150 lb)   SpO2 100%   BMI 24.21 kg/m   Physical Exam  Constitutional: She is oriented to person, place, and time. She appears well-developed and well-nourished.  HENT:  Head: Normocephalic and  atraumatic.  Right Ear: Tympanic membrane normal.  Left Ear: Tympanic membrane normal.  Mouth/Throat: Uvula is midline and oropharynx is clear and moist. No uvula swelling. No oropharyngeal exudate, posterior oropharyngeal edema or posterior oropharyngeal erythema.  Eyes: Pupils are equal, round, and reactive to light.  Neck: Neck supple.  Cardiovascular: Normal rate, regular rhythm and normal heart sounds.  Pulmonary/Chest: Effort normal. No respiratory distress. She has no wheezes.  Abdominal: Soft. Bowel sounds are normal.  Lymphadenopathy:    She has no cervical adenopathy.  Neurological: She is alert and oriented to person, place, and time.  Skin: Skin  is warm and dry.  Psychiatric: She has a normal mood and affect.  Nursing note and vitals reviewed.    ED Treatments / Results  Labs (all labs ordered are listed, but only abnormal results are displayed) Labs Reviewed  RAPID STREP SCREEN ([facility] ONLY)  CULTURE, GROUP A STREP [facility])    EKG None  Radiology No results found.  Procedures Procedures (including critical care time)  Medications Ordered in ED Medications - No data to display   Initial Impression / Assessment and Plan / ED Course  I have reviewed the triage vital signs and the nursing notes.  Pertinent labs & imaging results that were available during my care of the patient were reviewed by me and considered in my medical decision making (see chart for details).     She presents with right-sided sore throat.  She is overall nontoxic-appearing on exam and vital signs are reassuring.  No signs or symptoms of deep space infection.  Unclear etiology of pain.  She does report some allergy symptoms.  Recommend Flonase and nasal saline for any postnasal drip.  After history, exam, and medical workup I feel the patient has been appropriately medically screened and is safe for discharge home. Pertinent diagnoses were discussed with the patient. Patient was given return precautions.   Final Clinical Impressions(s) / ED Diagnoses   Final diagnoses:  Sore throat    ED Discharge Orders        Ordered    naproxen (NAPROSYN) 500 MG tablet  2 times daily     05/04/18 0011    fluticasone (FLONASE) 50 MCG/ACT nasal spray  Daily     05/04/18 0012    sodium chloride (OCEAN) 0.65 % SOLN nasal spray  As needed     05/04/18 0012       Edvin Albus, Mayer Masker, MD 05/04/18 [phone]

## 2018-05-04 NOTE — Discharge Instructions (Signed)
You were seen today for sore throat/tonsil pain.  You exam is reassuring.  Start Flonase and nasal saline.

## 2018-05-06 LAB — CULTURE, GROUP A STREP (THRC)

## 2019-07-13 ENCOUNTER — Encounter (HOSPITAL_COMMUNITY): Payer: Self-pay

## 2019-07-13 ENCOUNTER — Other Ambulatory Visit: Payer: Self-pay

## 2019-07-13 ENCOUNTER — Ambulatory Visit (INDEPENDENT_AMBULATORY_CARE_PROVIDER_SITE_OTHER)
Admission: EM | Admit: 2019-07-13 | Discharge: 2019-07-13 | Disposition: A | Discharge status: Home / Self Care | Payer: BC Managed Care – PPO | Source: Home / Self Care

## 2019-07-13 ENCOUNTER — Encounter (HOSPITAL_BASED_OUTPATIENT_CLINIC_OR_DEPARTMENT_OTHER): Payer: Self-pay | Admitting: *Deleted

## 2019-07-13 ENCOUNTER — Emergency Department (HOSPITAL_BASED_OUTPATIENT_CLINIC_OR_DEPARTMENT_OTHER)
Admission: EM | Admit: 2019-07-13 | Discharge: 2019-07-13 | Disposition: A | Discharge status: Home / Self Care | Payer: BC Managed Care – PPO | Attending: Emergency Medicine | Admitting: Emergency Medicine

## 2019-07-13 DIAGNOSIS — R21 Rash and other nonspecific skin eruption: Secondary | ICD-10-CM

## 2019-07-13 DIAGNOSIS — J029 Acute pharyngitis, unspecified: Secondary | ICD-10-CM | POA: Diagnosis not present

## 2019-07-13 DIAGNOSIS — R221 Localized swelling, mass and lump, neck: Secondary | ICD-10-CM | POA: Diagnosis not present

## 2019-07-13 DIAGNOSIS — L299 Pruritus, unspecified: Secondary | ICD-10-CM | POA: Diagnosis not present

## 2019-07-13 HISTORY — DX: Allergy, unspecified, initial encounter: T78.40XA

## 2019-07-13 LAB — GROUP A STREP BY PCR: Group A Strep by PCR: NOT DETECTED

## 2019-07-13 MED ORDER — NAPROXEN 500 MG PO TABS: 500.0000 mg | ORAL_TABLET | Freq: Two times a day (BID) | ORAL | 0 refills | Status: DC

## 2019-07-13 MED ORDER — FAMOTIDINE 20 MG PO TABS: 20.0000 mg | ORAL_TABLET | Freq: Two times a day (BID) | ORAL | 0 refills | Status: DC

## 2019-07-13 MED ORDER — DIPHENHYDRAMINE HCL 25 MG PO CAPS
25.0000 mg | ORAL_CAPSULE | Freq: Once | ORAL | Status: AC
  Administered 2019-07-13: 25 mg via ORAL
  Filled 2019-07-13: qty 1

## 2019-07-13 MED ORDER — FAMOTIDINE 20 MG PO TABS
20.0000 mg | ORAL_TABLET | Freq: Once | ORAL | Status: AC
  Administered 2019-07-13: 20 mg via ORAL
  Filled 2019-07-13: qty 1

## 2019-07-13 MED ORDER — DIPHENHYDRAMINE HCL 25 MG PO TABS: 25.0000 mg | ORAL_TABLET | Freq: Four times a day (QID) | ORAL | 0 refills | Status: DC

## 2019-07-13 MED ORDER — TRIAMCINOLONE ACETONIDE 0.025 % EX CREA: 1.0000 "application " | TOPICAL_CREAM | Freq: Two times a day (BID) | CUTANEOUS | 0 refills | Status: DC

## 2019-07-13 MED ORDER — HYDROXYZINE HCL 25 MG PO TABS
12.5000 mg | ORAL_TABLET | Freq: Three times a day (TID) | ORAL | 0 refills | Status: AC | PRN
Start: 2019-07-13 — End: ?

## 2019-07-13 NOTE — ED Triage Notes (Signed)
Pt presents with rash on the side of her face from unknown source that she believes to be an allergic reaction.

## 2019-07-13 NOTE — Discharge Instructions (Addendum)
You were evaluated in the Emergency Department and after careful evaluation, we did not find any emergent condition requiring admission or further testing in the hospital.  Your symptoms today seem to be due to an allergic reaction.  Your throat swab was negative for strep throat.  Please use medications as needed for your symptoms and follow-up with your regular doctor.  Please return to the Emergency Department if you experience any worsening of your condition.  We encourage you to follow up with a primary care provider.  Thank you for allowing [place] to be a part of your care.

## 2019-07-13 NOTE — ED Triage Notes (Addendum)
Rash on both sides of her face at the location where her mask touches her face. She also states she felt like a chip is stuck in the back of her throat.

## 2019-07-13 NOTE — ED Provider Notes (Addendum)
MRN: [id number] DOB: 03/12/1996  Subjective:   Katrina Maldonado is a 23 y.o. female presenting for 1 day history of rash over her lower face extending into her neck.  Patient reports that the rash is itchy but not responding to Benadryl which she was prescribed a day at [facility] [place].  Patient reports that she was tested for strep which is seen on labs and was negative.  No strep culture is pending.  She also reports that she has a sensation of a lump in her throat.  Denies eating any new foods, starting new medications, exposure to poisonous plants, new hygiene products, new cleaning products or detergents.  Denies any insect bites or insect stings.  No current facility-administered medications for this encounter.   Current Outpatient Medications:  .  cetirizine (ZYRTEC ALLERGY) 10 MG tablet, Take 1 tablet (10 mg total) by mouth daily., Disp: 30 tablet, Rfl: 0 .  diphenhydrAMINE (BENADRYL) 25 MG tablet, Take 1 tablet (25 mg total) by mouth every 6 (six) hours., Disp: 20 tablet, Rfl: 0 .  famotidine (PEPCID) 20 MG tablet, Take 1 tablet (20 mg total) by mouth 2 (two) times daily., Disp: 30 tablet, Rfl: 0 .  fluticasone (FLONASE) 50 MCG/ACT nasal spray, Place 2 sprays into both nostrils daily., Disp: 16 g, Rfl: 0 .  naproxen (NAPROSYN) 500 MG tablet, Take 1 tablet (500 mg total) by mouth 2 (two) times daily., Disp: 30 tablet, Rfl: 0 .  sodium chloride (OCEAN) 0.65 % SOLN nasal spray, Place 1 spray into both nostrils as needed for congestion., Disp: 480 mL, Rfl: 0 .  traMADol (ULTRAM) 50 MG tablet, Take 1 tablet (50 mg total) by mouth every 6 (six) hours as needed., Disp: 15 tablet, Rfl: 0 .  triamcinolone (NASACORT) 55 MCG/ACT AERO nasal inhaler, Place 2 sprays into the nose daily., Disp: 1 Bottle, Rfl: 0 .  vitamin C (ASCORBIC ACID) 500 MG tablet, Take 500 mg by mouth daily., Disp: , Rfl:    No Known Allergies  Past Medical History:  Diagnosis Date  . Allergy       History reviewed. No pertinent surgical history.  Review of Systems  Constitutional: Negative for fever and malaise/fatigue.  HENT: Negative for congestion, ear pain, sinus pain and sore throat.        Sensation of lump in throat.  Eyes: Negative for blurred vision, double vision, discharge and redness.  Respiratory: Negative for cough, hemoptysis, shortness of breath and wheezing.   Cardiovascular: Negative for chest pain.  Gastrointestinal: Negative for abdominal pain, diarrhea, nausea and vomiting.  Genitourinary: Negative for dysuria, flank pain and hematuria.  Musculoskeletal: Negative for myalgias.  Skin: Positive for itching and rash.  Neurological: Negative for dizziness, weakness and headaches.  Psychiatric/Behavioral: Negative for depression and substance abuse.    Objective:   Vitals: BP 120/84 (BP Location: Right Arm)   Pulse 83   Temp 98.6 F (37 C) (Oral)   Resp 18   LMP 07/06/2019   SpO2 98%   Physical Exam Constitutional:      General: She is not in acute distress.    Appearance: Normal appearance. She is well-developed. She is not ill-appearing, toxic-appearing or diaphoretic.  HENT:     Head: Normocephalic and atraumatic.     Right Ear: Tympanic membrane and ear canal normal. No drainage or tenderness. No middle ear effusion. Tympanic membrane is not erythematous.     Left Ear: Tympanic membrane and ear canal normal.  No drainage or tenderness.  No middle ear effusion. Tympanic membrane is not erythematous.     Nose: Nose normal. No congestion or rhinorrhea.     Mouth/Throat:     Mouth: Mucous membranes are moist. No oral lesions.     Pharynx: Oropharynx is clear. No pharyngeal swelling, oropharyngeal exudate, posterior oropharyngeal erythema or uvula swelling.     Tonsils: No tonsillar exudate or tonsillar abscesses.  Eyes:     General: No scleral icterus.    Extraocular Movements: Extraocular movements intact.     Right eye: Normal extraocular  motion.     Left eye: Normal extraocular motion.     Conjunctiva/sclera: Conjunctivae normal.     Pupils: Pupils are equal, round, and reactive to light.  Neck:     Musculoskeletal: Normal range of motion and neck supple.  Cardiovascular:     Rate and Rhythm: Normal rate and regular rhythm.     Pulses: Normal pulses.     Heart sounds: Normal heart sounds. No murmur. No friction rub. No gallop.   Pulmonary:     Effort: Pulmonary effort is normal. No respiratory distress.     Breath sounds: Normal breath sounds. No stridor. No wheezing, rhonchi or rales.  Lymphadenopathy:     Cervical: No cervical adenopathy.  Skin:    General: Skin is warm and dry.     Findings: No rash.  Neurological:     General: No focal deficit present.     Mental Status: She is alert and oriented to person, place, and time.  Psychiatric:        Mood and Affect: Mood normal.        Behavior: Behavior normal.        Thought Content: Thought content normal.     Results for orders placed or performed during the hospital encounter of 07/13/19 (from the past 24 hour(s))  Group A Strep by PCR     Status: None   Collection Time: 07/13/19  1:17 PM   Specimen: Throat; Sterile Swab  Result Value Ref Range   Group A Strep by PCR NOT DETECTED NOT DETECTED    Assessment and Plan :   1. Rash   2. Itching     Will order strep culture.  Patient has no new exposures and therefore it is difficult to establish whether or not this is an allergic reaction.  Her airway is patent and overall physical exam nonemergent.  We will have patient use a low-dose steroid cream for her rash, Vistaril for the itching, naproxen for any kind of inflammation about her throat. Counseled patient on potential for adverse effects with medications prescribed/recommended today, ER and return-to-clinic precautions discussed, patient verbalized understanding. Counseled on need to establish with a PCP for further work-up and follow-up.  I placed  patient in a work you with [facility] for PCP assistance    Jaynee Eagles, Hershal Coria 07/13/19 1737

## 2019-07-13 NOTE — ED Provider Notes (Signed)
[facility] Emergency Department Provider Note MRN:  [id number]  Arrival date & time: 07/13/19     Chief Complaint   Rash   History of Present Illness   Katrina Maldonado is a 23 y.o. year-old female with no pertinent past medical history presenting to the ED with chief complaint of rash.  Patient noticed rash to her bilateral face and neck this morning.  Itchy.  Also endorsing some sore throat for the past 2 days.  Had a sensation of foreign body in her throat, is now resolved.  Denies fever, no chest pain, shortness of breath.  Review of Systems  A complete 10 system review of systems was obtained and all systems are negative except as noted in the HPI and PMH.   Patient's Health History   History reviewed. No pertinent past medical history.  History reviewed. No pertinent surgical history.  No family history on file.  Social History   Socioeconomic History  . Marital status: Single    Spouse name: Not on file  . Number of children: Not on file  . Years of education: Not on file  . Highest education level: Not on file  Occupational History  . Not on file  Social Needs  . Financial resource strain: Not on file  . Food insecurity    Worry: Not on file    Inability: Not on file  . Transportation needs    Medical: Not on file    Non-medical: Not on file  Tobacco Use  . Smoking status: Never Smoker  . Smokeless tobacco: Never Used  Substance and Sexual Activity  . Alcohol use: Yes  . Drug use: No  . Sexual activity: Not on file  Lifestyle  . Physical activity    Days per week: Not on file    Minutes per session: Not on file  . Stress: Not on file  Relationships  . Social Musicianconnections    Talks on phone: Not on file    Gets together: Not on file    Attends religious service: Not on file    Active member of club or organization: Not on file    Attends meetings of clubs or organizations: Not on file    Relationship status: Not on file  .  Intimate partner violence    Fear of current or ex partner: Not on file    Emotionally abused: Not on file    Physically abused: Not on file    Forced sexual activity: Not on file  Other Topics Concern  . Not on file  Social History Narrative  . Not on file     Physical Exam  Vital Signs and Nursing Notes reviewed Vitals:   07/13/19 1215  BP: (!) 138/98  Pulse: 82  Resp: 12  Temp: 98.3 F (36.8 C)  SpO2: 100%    CONSTITUTIONAL: Well-appearing, NAD NEURO:  Alert and oriented x 3, no focal deficits EYES:  eyes equal and reactive ENT/NECK:  no LAD, no JVD CARDIO: Regular rate, well-perfused, normal S1 and S2 PULM:  CTAB no wheezing or rhonchi GI/GU:  normal bowel sounds, non-distended, non-tender MSK/SPINE:  No gross deformities, no edema SKIN: Faint erythematous papular rash to the bilateral cheeks and anterior neck PSYCH:  Appropriate speech and behavior  Diagnostic and Interventional Summary    Labs Reviewed  GROUP A STREP BY PCR    No orders to display    Medications  diphenhydrAMINE (BENADRYL) capsule 25 mg (25 mg Oral Given  07/13/19 1316)  famotidine (PEPCID) tablet 20 mg (20 mg Oral Given 07/13/19 1316)     Procedures Critical Care  ED Course and Medical Decision Making  I have reviewed the triage vital signs and the nursing notes.  Pertinent labs & imaging results that were available during my care of the patient were reviewed by me and considered in my medical decision making (see below for details).  Strep throat with scarlet fever versus allergic reaction versus viral rash in this otherwise healthy 23 year old female with normal vital signs, very well-appearing.  Will swab for strep and treat with antibiotics if positive, if not we will treat with antihistamines.  After the discussed management above, the patient was determined to be safe for discharge.  The patient was in agreement with this plan and all questions regarding their care were answered.  ED  return precautions were discussed and the patient will return to the ED with any significant worsening of condition.  Barth Kirks. Sedonia Small, MD [place] [email or web address]  Final Clinical Impressions(s) / ED Diagnoses     ICD-10-CM   1. Rash  R21   2. Sore throat  J02.9     ED Discharge Orders    None         Katrina Flakes, MD 07/13/19 1319

## 2019-07-16 LAB — CULTURE, GROUP A STREP (THRC)

## 2019-07-21 ENCOUNTER — Other Ambulatory Visit: Payer: Self-pay

## 2019-07-21 ENCOUNTER — Ambulatory Visit (HOSPITAL_COMMUNITY)
Admission: EM | Admit: 2019-07-21 | Discharge: 2019-07-21 | Disposition: A | Discharge status: Home / Self Care | Payer: BC Managed Care – PPO | Attending: Family Medicine | Admitting: Family Medicine

## 2019-07-21 ENCOUNTER — Encounter (HOSPITAL_COMMUNITY): Payer: Self-pay | Admitting: Emergency Medicine

## 2019-07-21 DIAGNOSIS — Z202 Contact with and (suspected) exposure to infections with a predominantly sexual mode of transmission: Secondary | ICD-10-CM | POA: Diagnosis not present

## 2019-07-21 DIAGNOSIS — M795 Residual foreign body in soft tissue: Secondary | ICD-10-CM | POA: Diagnosis present

## 2019-07-21 NOTE — ED Provider Notes (Signed)
[facility]    CSN: [id number] Arrival date & time: 07/21/19  1616     History   Chief Complaint Chief Complaint  Patient presents with   SEXUALLY TRANSMITTED DISEASE    Random testing   Ear Problem    bilateral    HPI Katrina Maldonado is a 23 y.o. female.   Patient presents with bilateral earrings embedded in both of her upper ears which she would like removed.  She states she believes she changed her earrings back in May but is not sure.  She denies fever, redness, drainage, or other signs of infection.  She states she also would like to have a random STD tests and treatment today.  She denies symptoms, including vaginal discharge, abdominal pain, dysuria, back pain, pelvic pain.  She is sexually active and does not use protection.  The history is provided by the patient.    Past Medical History:  Diagnosis Date   Allergy     There are no active problems to display for this patient.   History reviewed. No pertinent surgical history.  OB History   No obstetric history on file.      Home Medications    Prior to Admission medications   Medication Sig Start Date End Date Taking? Authorizing Provider  cetirizine (ZYRTEC ALLERGY) 10 MG tablet Take 1 tablet (10 mg total) by mouth daily. 11/27/17   Pricilla LovelessGoldston, Scott, MD  diphenhydrAMINE (BENADRYL) 25 MG tablet Take 1 tablet (25 mg total) by mouth every 6 (six) hours. 07/13/19   Sabas SousBero, Michael M, MD  famotidine (PEPCID) 20 MG tablet Take 1 tablet (20 mg total) by mouth 2 (two) times daily. 07/13/19   Sabas SousBero, Michael M, MD  fluticasone (FLONASE) 50 MCG/ACT nasal spray Place 2 sprays into both nostrils daily. 05/04/18   Horton, Mayer Maskerourtney F, MD  hydrOXYzine (ATARAX/VISTARIL) 25 MG tablet Take 0.5-1 tablets (12.5-25 mg total) by mouth every 8 (eight) hours as needed for itching. 07/13/19   Wallis BambergMani, Mario, PA-C  naproxen (NAPROSYN) 500 MG tablet Take 1 tablet (500 mg total) by mouth 2 (two) times daily. 07/13/19   Wallis BambergMani, Mario,  PA-C  sodium chloride (OCEAN) 0.65 % SOLN nasal spray Place 1 spray into both nostrils as needed for congestion. 05/04/18   Horton, Mayer Maskerourtney F, MD  traMADol (ULTRAM) 50 MG tablet Take 1 tablet (50 mg total) by mouth every 6 (six) hours as needed. 03/10/18   Rolan BuccoBelfi, Melanie, MD  triamcinolone (KENALOG) 0.025 % cream Apply 1 application topically 2 (two) times daily. 07/13/19   Wallis BambergMani, Mario, PA-C  triamcinolone (NASACORT) 55 MCG/ACT AERO nasal inhaler Place 2 sprays into the nose daily. 11/27/17   Pricilla LovelessGoldston, Scott, MD  vitamin C (ASCORBIC ACID) 500 MG tablet Take 500 mg by mouth daily.    [provider]    Family History Family History  Family history unknown: Yes    Social History Social History   Tobacco Use   Smoking status: Never Smoker   Smokeless tobacco: Never Used  Substance Use Topics   Alcohol use: Yes   Drug use: No     Allergies   Patient has no known allergies.   Review of Systems Review of Systems  Constitutional: Negative for chills and fever.  HENT: Negative for ear pain and sore throat.   Eyes: Negative for pain and visual disturbance.  Respiratory: Negative for cough and shortness of breath.   Cardiovascular: Negative for chest pain and palpitations.  Gastrointestinal: Negative for abdominal pain,  diarrhea and vomiting.  Genitourinary: Negative for dysuria, flank pain, hematuria, pelvic pain and vaginal discharge.  Musculoskeletal: Negative for arthralgias and back pain.  Skin: Positive for wound. Negative for color change and rash.  Neurological: Negative for seizures and syncope.  All other systems reviewed and are negative.    Physical Exam Triage Vital Signs ED Triage Vitals  Enc Vitals Group     BP      Pulse      Resp      Temp      Temp src      SpO2      Weight      Height      Head Circumference      Peak Flow      Pain Score      Pain Loc      Pain Edu?      Excl. in GC?    No data found.  Updated Vital Signs BP  126/90 (BP Location: Left Arm)    Pulse 72    Temp 98.9 F (37.2 C) (Temporal)    Resp 12    LMP 07/06/2019    SpO2 99%   Visual Acuity Right Eye Distance:   Left Eye Distance:   Bilateral Distance:    Right Eye Near:   Left Eye Near:    Bilateral Near:     Physical Exam Vitals signs and nursing note reviewed.  Constitutional:      General: She is not in acute distress.    Appearance: She is well-developed.  HENT:     Head: Normocephalic and atraumatic.  Eyes:     Conjunctiva/sclera: Conjunctivae normal.  Neck:     Musculoskeletal: Neck supple.  Cardiovascular:     Rate and Rhythm: Normal rate and regular rhythm.     Heart sounds: No murmur.  Pulmonary:     Effort: Pulmonary effort is normal. No respiratory distress.     Breath sounds: Normal breath sounds.  Abdominal:     Palpations: Abdomen is soft.     Tenderness: There is no abdominal tenderness. There is no right CVA tenderness, left CVA tenderness, guarding or rebound.  Skin:    General: Skin is warm and dry.     Comments: Stud earrings embedded in bilateral upper ear cartilage.  Neurological:     Mental Status: She is alert.      UC Treatments / Results  Labs (all labs ordered are listed, but only abnormal results are displayed) Labs Reviewed  CERVICOVAGINAL ANCILLARY ONLY    EKG   Radiology No results found.  Procedures Foreign Body Removal  Date/Time: 07/21/2019 7:22 PM Performed by: Mickie Bailate, Deysha Cartier H, NP Authorized by: Eustace MooreNelson, Yvonne Sue, MD   Consent:    Consent obtained:  Verbal   Consent given by:  Patient Location:    Location:  Ear Anesthesia (see MAR for exact dosages):    Anesthesia method:  Local infiltration   Local anesthetic:  Lidocaine 1% w/o epi Procedure details:    Scalpel size:  11   Removal mechanism:  Forceps   Intact foreign body removal: yes   Post-procedure details:    Neurovascular status: intact     Confirmation:  No additional foreign bodies on visualization    Skin closure:  None   Dressing:  Antibiotic ointment   Patient tolerance of procedure:  Tolerated well, no immediate complications   (including critical care time)  Medications Ordered in UC Medications - No data to display  Initial Impression / Assessment and Plan / UC Course  I have reviewed the triage vital signs and the nursing notes.  Pertinent labs & imaging results that were available during my care of the patient were reviewed by me and considered in my medical decision making (see chart for details).   Foreign body in soft tissue.  Stud earrings embedded in bilateral upper ears; with removed; patient tolerated the procedures well.  Discussed with patient wound care instructions and signs of infection.  Potential exposure to STD.  Patient is asymptomatic but is sexually active without protection.  She requests testing today.  Discussed with patient that she should abstain from sex until her test results are back.  Discussed that we will call her if any results indicate need for treatment.  Discussed that if test results come back positive, her sexual partner will need to be treated also.     Final Clinical Impressions(s) / UC Diagnoses   Final diagnoses:  Foreign body (FB) in soft tissue  Potential exposure to STD     Discharge Instructions     Keep the areas on both your ears clean and dry.  Apply Neosporin twice daily.  Watch for signs of infection such as pus, redness, swelling, warmth, pain.  Return here or follow-up with your primary care provider if you see signs of infection.    Your STD tests are pending.  We will call you if any treatment is indicated.  You should not have sex until your test are back.  If anything comes back positive, your sexual partner will need to be treated also.        ED Prescriptions    None     Controlled Substance Prescriptions [place] Controlled Substance Registry consulted? Not Applicable   Sharion Balloon, NP 07/21/19 1927

## 2019-07-21 NOTE — ED Triage Notes (Signed)
Pt here for random STD testing.  She denies exposure or symptoms at this time  Pt also reports she has earrings stuck in the cartilage of both of her ears.

## 2019-07-21 NOTE — Discharge Instructions (Addendum)
Keep the areas on both your ears clean and dry.  Apply Neosporin twice daily.  Watch for signs of infection such as pus, redness, swelling, warmth, pain.  Return here or follow-up with your primary care provider if you see signs of infection.    Your STD tests are pending.  We will call you if any treatment is indicated.  You should not have sex until your test are back.  If anything comes back positive, your sexual partner will need to be treated also.

## 2019-07-24 LAB — CERVICOVAGINAL ANCILLARY ONLY
Bacterial vaginitis: POSITIVE — AB
Chlamydia: NEGATIVE
Neisseria Gonorrhea: NEGATIVE
Trichomonas: NEGATIVE

## 2019-07-27 ENCOUNTER — Telehealth (HOSPITAL_COMMUNITY): Payer: Self-pay | Admitting: Emergency Medicine

## 2019-07-27 MED ORDER — METRONIDAZOLE 500 MG PO TABS: 500.0000 mg | ORAL_TABLET | Freq: Two times a day (BID) | ORAL | 0 refills | Status: AC

## 2019-07-27 NOTE — Telephone Encounter (Signed)
Bacterial vaginosis is positive. This was not treated at the urgent care visit.  Flagyl 500 mg BID x 7 days #14 no refills sent to patients pharmacy of choice.    Patient contacted and made aware of all results, all questions answered.  

## 2019-11-15 ENCOUNTER — Emergency Department (HOSPITAL_BASED_OUTPATIENT_CLINIC_OR_DEPARTMENT_OTHER)
Admission: EM | Admit: 2019-11-15 | Discharge: 2019-11-15 | Disposition: A | Discharge status: Home / Self Care | Payer: BC Managed Care – PPO | Attending: Emergency Medicine | Admitting: Emergency Medicine

## 2019-11-15 ENCOUNTER — Other Ambulatory Visit: Payer: Self-pay

## 2019-11-15 ENCOUNTER — Emergency Department (HOSPITAL_BASED_OUTPATIENT_CLINIC_OR_DEPARTMENT_OTHER): Payer: BC Managed Care – PPO

## 2019-11-15 ENCOUNTER — Encounter (HOSPITAL_BASED_OUTPATIENT_CLINIC_OR_DEPARTMENT_OTHER): Payer: Self-pay | Admitting: *Deleted

## 2019-11-15 DIAGNOSIS — U071 COVID-19: Secondary | ICD-10-CM | POA: Insufficient documentation

## 2019-11-15 DIAGNOSIS — Z79899 Other long term (current) drug therapy: Secondary | ICD-10-CM | POA: Insufficient documentation

## 2019-11-15 NOTE — ED Provider Notes (Signed)
[facility] EMERGENCY DEPARTMENT Provider Note   CSN: [id number] Arrival date & time: 11/15/19  2018     History   Chief Complaint Chief Complaint  Patient presents with  . Shortness of Breath    +covid test    HPI Katrina Maldonado is a 23 y.o. female.     Patient presents to the emergency department with complaint of shortness of breath in setting of a positive Covid test.  Patient states that she started having symptoms 7 days ago.  Symptoms include a dry throat and shortness of breath as well as an occasional cough.  She denies any fevers.  She has not lost her sense of taste or smell.  She has not had any vomiting or diarrhea.  No abdominal pain or urinary symptoms.  She is concerned because her mother at home only has 1 lung.  Lightheadedness at times but no syncope.  She has been using over-the-counter medications and herbal treatments for her symptoms.     Past Medical History:  Diagnosis Date  . Allergy     There are no active problems to display for this patient.   History reviewed. No pertinent surgical history.   OB History   No obstetric history on file.      Home Medications    Prior to Admission medications   Medication Sig Start Date End Date Taking? Authorizing Provider  cetirizine (ZYRTEC ALLERGY) 10 MG tablet Take 1 tablet (10 mg total) by mouth daily. 11/27/17   Pricilla LovelessGoldston, Scott, MD  diphenhydrAMINE (BENADRYL) 25 MG tablet Take 1 tablet (25 mg total) by mouth every 6 (six) hours. 07/13/19   Sabas SousBero, Michael M, MD  famotidine (PEPCID) 20 MG tablet Take 1 tablet (20 mg total) by mouth 2 (two) times daily. 07/13/19   Sabas SousBero, Michael M, MD  fluticasone (FLONASE) 50 MCG/ACT nasal spray Place 2 sprays into both nostrils daily. 05/04/18   Horton, Mayer Maskerourtney F, MD  hydrOXYzine (ATARAX/VISTARIL) 25 MG tablet Take 0.5-1 tablets (12.5-25 mg total) by mouth every 8 (eight) hours as needed for itching. 07/13/19   Wallis BambergMani, Mario, PA-C  naproxen (NAPROSYN) 500 MG  tablet Take 1 tablet (500 mg total) by mouth 2 (two) times daily. 07/13/19   Wallis BambergMani, Mario, PA-C  sodium chloride (OCEAN) 0.65 % SOLN nasal spray Place 1 spray into both nostrils as needed for congestion. 05/04/18   Horton, Mayer Maskerourtney F, MD  traMADol (ULTRAM) 50 MG tablet Take 1 tablet (50 mg total) by mouth every 6 (six) hours as needed. 03/10/18   Rolan BuccoBelfi, Melanie, MD  triamcinolone (KENALOG) 0.025 % cream Apply 1 application topically 2 (two) times daily. 07/13/19   Wallis BambergMani, Mario, PA-C  triamcinolone (NASACORT) 55 MCG/ACT AERO nasal inhaler Place 2 sprays into the nose daily. 11/27/17   Pricilla LovelessGoldston, Scott, MD  vitamin C (ASCORBIC ACID) 500 MG tablet Take 500 mg by mouth daily.    [provider]    Family History Family History  Family history unknown: Yes    Social History Social History   Tobacco Use  . Smoking status: Never Smoker  . Smokeless tobacco: Never Used  Substance Use Topics  . Alcohol use: Yes  . Drug use: No     Allergies   Patient has no known allergies.   Review of Systems Review of Systems  Constitutional: Negative for fever.  HENT: Positive for sore throat. Negative for rhinorrhea.   Eyes: Negative for redness.  Respiratory: Positive for cough and shortness of breath.  Cardiovascular: Negative for chest pain.  Gastrointestinal: Negative for abdominal pain, diarrhea, nausea and vomiting.  Genitourinary: Negative for dysuria.  Musculoskeletal: Negative for myalgias.  Skin: Negative for rash.  Neurological: Positive for light-headedness. Negative for headaches.     Physical Exam Updated Vital Signs BP 136/81 (BP Location: Right Arm)   Pulse 71   Temp 99.1 F (37.3 C) (Oral)   Resp 18   Ht 5\' 6"  (1.676 m)   Wt 65.8 kg   SpO2 99%   BMI 23.40 kg/m   Physical Exam Vitals signs and nursing note reviewed.  Constitutional:      Appearance: She is well-developed.  HENT:     Head: Normocephalic and atraumatic.  Eyes:     General:        Right eye:  No discharge.        Left eye: No discharge.     Conjunctiva/sclera: Conjunctivae normal.  Neck:     Musculoskeletal: Normal range of motion and neck supple.  Cardiovascular:     Rate and Rhythm: Normal rate and regular rhythm.     Heart sounds: Normal heart sounds.  Pulmonary:     Effort: Pulmonary effort is normal.     Breath sounds: Normal breath sounds.  Abdominal:     Palpations: Abdomen is soft.     Tenderness: There is no abdominal tenderness.  Skin:    General: Skin is warm and dry.  Neurological:     Mental Status: She is alert.      ED Treatments / Results  Labs (all labs ordered are listed, but only abnormal results are displayed) Labs Reviewed - No data to display  EKG None  Radiology No results found.  Procedures Procedures (including critical care time)  Medications Ordered in ED Medications - No data to display   Initial Impression / Assessment and Plan / ED Course  I have reviewed the triage vital signs and the nursing notes.  Pertinent labs & imaging results that were available during my care of the patient were reviewed by me and considered in my medical decision making (see chart for details).        Patient seen and examined.  Patient is in no distress whatsoever.  Vital signs are reassuring.  Given reported degree of shortness of breath, will check chest x-ray to evaluate for pneumonia.  Anticipate the patient will be able to go home with appropriate isolation and symptomatic treatment.  Vital signs reviewed and are as follows: BP 136/81 (BP Location: Right Arm)   Pulse 71   Temp 99.1 F (37.3 C) (Oral)   Resp 18   Ht 5\' 6"  (1.676 m)   Wt 65.8 kg   SpO2 99%   BMI 23.40 kg/m   10:03 PM chest x-ray without signs of pneumonia.  Patient will be discharged home with continued isolation for total 14 days with at least 3 days of improvement.  Counseled to return with worsening shortness of breath, trouble breathing, persistent vomiting or  fevers.  She verbalizes understanding and agrees with plan.  Final Clinical Impressions(s) / ED Diagnoses   Final diagnoses:  COVID-19   Patient with COVID-19, reported positive test.  Symptoms are mild.  Main complaint today is shortness of breath however she is not hypoxic or tachypneic.  She talks in full sentences without any distress.  Chest x-ray is clear.  Plan on discharge to home with continued supportive treatment and strict return instructions.  ED Discharge Orders  None       Carlisle Cater, Hershal Coria 11/15/19 2204    Little, Wenda Overland, MD 11/16/19 [phone]

## 2019-11-15 NOTE — Discharge Instructions (Signed)
Please read and follow all provided instructions.  Your diagnoses today include:  1. COVID-19     Tests performed today include:  Chest x-ray - no pneumonia or other problems  Vital signs. See below for your results today.   Medications prescribed:   None  Take any prescribed medications only as directed.  Home care instructions:  Follow any educational materials contained in this packet.  BE VERY CAREFUL not to take multiple medicines containing Tylenol (also called acetaminophen). Doing so can lead to an overdose which can damage your liver and cause liver failure and possibly death.   Follow-up instructions: Please follow-up with your primary care provider as needed for further evaluation of your symptoms.   Return instructions:   Please return to the Emergency Department if you experience worsening symptoms.   Please return with worsening shortness of breath, trouble breathing, persistent fever, persistent vomiting.  Please return if you have any other emergent concerns.  Additional Information:  Your vital signs today were: BP 136/81 (BP Location: Right Arm)    Pulse 71    Temp 99.1 F (37.3 C) (Oral)    Resp 18    Ht 5\' 6"  (1.676 m)    Wt 65.8 kg    SpO2 99%    BMI 23.40 kg/m  If your blood pressure (BP) was elevated above 135/85 this visit, please have this repeated by your doctor within one month. --------------

## 2019-11-15 NOTE — ED Triage Notes (Signed)
Pt reports +covid test. States she works at [employer] and believes that is where she may have been exposed. Reports feeling light headed today and SOB. Denies cough

## 2019-11-24 ENCOUNTER — Emergency Department (HOSPITAL_BASED_OUTPATIENT_CLINIC_OR_DEPARTMENT_OTHER)
Admission: EM | Admit: 2019-11-24 | Discharge: 2019-11-24 | Disposition: A | Discharge status: Home / Self Care | Payer: BC Managed Care – PPO | Attending: Emergency Medicine | Admitting: Emergency Medicine

## 2019-11-24 ENCOUNTER — Other Ambulatory Visit: Payer: Self-pay

## 2019-11-24 ENCOUNTER — Encounter (HOSPITAL_BASED_OUTPATIENT_CLINIC_OR_DEPARTMENT_OTHER): Payer: Self-pay

## 2019-11-24 DIAGNOSIS — Z79899 Other long term (current) drug therapy: Secondary | ICD-10-CM | POA: Diagnosis not present

## 2019-11-24 DIAGNOSIS — R110 Nausea: Secondary | ICD-10-CM | POA: Insufficient documentation

## 2019-11-24 DIAGNOSIS — K5900 Constipation, unspecified: Secondary | ICD-10-CM | POA: Insufficient documentation

## 2019-11-24 MED ORDER — MAGNESIUM CITRATE PO SOLN: 1.0000 | Freq: Once | ORAL | 0 refills | Status: AC

## 2019-11-24 MED ORDER — POLYETHYLENE GLYCOL 3350 17 G PO PACK: 17.0000 g | PACK | Freq: Every day | ORAL | 0 refills | Status: AC

## 2019-11-24 NOTE — ED Provider Notes (Signed)
[facility] EMERGENCY DEPARTMENT Provider Note   CSN: [id number] Arrival date & time: 11/24/19  1310     History   Chief Complaint Chief Complaint  Patient presents with   Constipation    HPI Katrina Maldonado is a 23 y.o. female presents to emergency department today with chief complaint of constipation x 5 days. Her last bowel movement was today, she describes stool as small hard pellets.  She tried taking laxative pills at home without symptom relief.  She denies history of constipation.  Patient endorses bilateral lower extremity cramping sensation.  She rates the pain 2 out of 10 in severity.  She is able to tolerate p.o. intake.  She has nausea without emesis.  Admits to passing flatus.  She denies fever, chills, chest pain, vomiting, urinary symptoms, blood in stool, pelvic pain, vaginal discharge, vaginal bleeding.  No abdominal surgical history.   Past Medical History:  Diagnosis Date   Allergy     There are no active problems to display for this patient.   History reviewed. No pertinent surgical history.   OB History   No obstetric history on file.      Home Medications    Prior to Admission medications   Medication Sig Start Date End Date Taking? Authorizing Provider  cetirizine (ZYRTEC ALLERGY) 10 MG tablet Take 1 tablet (10 mg total) by mouth daily. 11/27/17   Pricilla Loveless, MD  diphenhydrAMINE (BENADRYL) 25 MG tablet Take 1 tablet (25 mg total) by mouth every 6 (six) hours. 07/13/19   Sabas Sous, MD  famotidine (PEPCID) 20 MG tablet Take 1 tablet (20 mg total) by mouth 2 (two) times daily. 07/13/19   Sabas Sous, MD  fluticasone (FLONASE) 50 MCG/ACT nasal spray Place 2 sprays into both nostrils daily. 05/04/18   Horton, Mayer Masker, MD  hydrOXYzine (ATARAX/VISTARIL) 25 MG tablet Take 0.5-1 tablets (12.5-25 mg total) by mouth every 8 (eight) hours as needed for itching. 07/13/19   Wallis Bamberg, PA-C  magnesium citrate SOLN Take 296 mLs (1  Bottle total) by mouth once for 1 dose. 11/24/19 11/24/19  Yoshie Kosel E, PA-C  naproxen (NAPROSYN) 500 MG tablet Take 1 tablet (500 mg total) by mouth 2 (two) times daily. 07/13/19   Wallis Bamberg, PA-C  polyethylene glycol (MIRALAX MIX-IN PAX) 17 g packet Take 17 g by mouth daily. 11/24/19   Jirah Rider E, PA-C  sodium chloride (OCEAN) 0.65 % SOLN nasal spray Place 1 spray into both nostrils as needed for congestion. 05/04/18   Horton, Mayer Masker, MD  traMADol (ULTRAM) 50 MG tablet Take 1 tablet (50 mg total) by mouth every 6 (six) hours as needed. 03/10/18   Rolan Bucco, MD  triamcinolone (KENALOG) 0.025 % cream Apply 1 application topically 2 (two) times daily. 07/13/19   Wallis Bamberg, PA-C  triamcinolone (NASACORT) 55 MCG/ACT AERO nasal inhaler Place 2 sprays into the nose daily. 11/27/17   Pricilla Loveless, MD  vitamin C (ASCORBIC ACID) 500 MG tablet Take 500 mg by mouth daily.    [provider]    Family History Family History  Family history unknown: Yes    Social History Social History   Tobacco Use   Smoking status: Never Smoker   Smokeless tobacco: Never Used  Substance Use Topics   Alcohol use: Yes    Comment: occ   Drug use: No     Allergies   Patient has no known allergies.   Review of Systems Review of Systems  Constitutional: Negative for chills and fever.  Respiratory: Negative for cough and shortness of breath.   Cardiovascular: Negative for chest pain.  Gastrointestinal: Positive for abdominal pain (Cramping), constipation and nausea. Negative for diarrhea, rectal pain and vomiting.  Genitourinary: Negative for flank pain, pelvic pain and vaginal bleeding.  Skin: Negative for rash and wound.     Physical Exam Updated Vital Signs BP 127/85 (BP Location: Right Arm)    Pulse 70    Temp 98.2 F (36.8 C) (Oral)    Resp 18    Ht 5\' 6"  (1.676 m)    Wt 65.8 kg    LMP 10/31/2019    SpO2 99%    BMI 23.40 kg/m   Physical Exam Vitals signs  and nursing note reviewed.  Constitutional:      General: She is not in acute distress.    Appearance: She is not ill-appearing.  HENT:     Head: Normocephalic and atraumatic.     Right Ear: Tympanic membrane and external ear normal.     Left Ear: Tympanic membrane and external ear normal.     Nose: Nose normal.     Mouth/Throat:     Mouth: Mucous membranes are moist.     Pharynx: Oropharynx is clear.  Eyes:     General: No scleral icterus.       Right eye: No discharge.        Left eye: No discharge.  Neck:     Musculoskeletal: Normal range of motion.     Vascular: No JVD.  Cardiovascular:     Rate and Rhythm: Normal rate and regular rhythm.     Pulses: Normal pulses.          Radial pulses are 2+ on the right side and 2+ on the left side.     Heart sounds: Normal heart sounds.  Pulmonary:     Comments: Lungs clear to auscultation in all fields. Symmetric chest rise. No wheezing, rales, or rhonchi. Abdominal:     General: Bowel sounds are normal.     Tenderness: There is no right CVA tenderness or left CVA tenderness.     Comments: Abdomen is soft, non-distended, and non-tender in all quadrants. No rigidity, no guarding. No peritoneal signs.  Musculoskeletal: Normal range of motion.  Skin:    General: Skin is warm and dry.     Capillary Refill: Capillary refill takes less than 2 seconds.  Neurological:     Mental Status: She is oriented to person, place, and time.     GCS: GCS eye subscore is 4. GCS verbal subscore is 5. GCS motor subscore is 6.     Comments: Fluent speech, no facial droop.  Psychiatric:        Behavior: Behavior normal.      ED Treatments / Results  Labs (all labs ordered are listed, but only abnormal results are displayed) Labs Reviewed - No data to display  EKG None  Radiology No results found.  Procedures Procedures (including critical care time)  Medications Ordered in ED Medications - No data to display   Initial Impression /  Assessment and Plan / ED Course  I have reviewed the triage vital signs and the nursing notes.  Pertinent labs & imaging results that were available during my care of the patient were reviewed by me and considered in my medical decision making (see chart for details).  Patient seen and examined.  She is well-appearing, in no acute distress.  She is  afebrile.  On exam she has no abdominal tenderness, no peritoneal signs.  No CVA tenderness.  Normoactive bowel sounds.  Engaged in shared decision making with patient regarding constipation treatment.  She is preferring to take mag citrate at home as well as perform a bowel cleanout.  Given her reassuring exam and vital signs I feel she is stable to be discharged home with symptomatic care.  She has had serial abdominal exams that are negative, very low suspicion for acute surgical abdomen or small bowel obstruction at this time.  Patient had a positive Covid test on 1115/2020 and states she has since had a negative test.  She reports to feel fully recovered from Covid illness.  The patient appears reasonably screened and/or stabilized for discharge and I doubt any other medical condition or other [facility] requiring further screening, evaluation, or treatment in the ED at this time prior to discharge. The patient is safe for discharge with strict return precautions discussed. Recommend pcp follow up if symptoms persist.   Portions of this note were generated with Dragon dictation software. Dictation errors may occur despite best attempts at proofreading.    Final Clinical Impressions(s) / ED Diagnoses   Final diagnoses:  Constipation, unspecified constipation type    ED Discharge Orders         Ordered    magnesium citrate SOLN   Once     11/24/19 1548    polyethylene glycol (MIRALAX MIX-IN PAX) 17 g packet  Daily     11/24/19 1548           Flint Melter 11/24/19 1623    Wyvonnia Dusky, MD 11/25/19 1233

## 2019-11-24 NOTE — ED Triage Notes (Signed)
Pt c/o constipation-reports last BM was 11/19-NAD-steady gait

## 2019-11-24 NOTE — Discharge Instructions (Addendum)
Prescription sent to your pharmacy for mag citrate.  This is to help you have a bowel movement.  Please take as prescribed.  To help reduce constipation and promote bowel health,   1. Drink at least 64 ounces of water each day; 2. Eat plenty of fiber (fruits, vegetables, whole grains, legumes) 3. Get plenty of physical activity  If needed, you may also use daily or as needed, MiraLax (Osmotic Laxative) up to  1-2 times a day and Colace (Stool Softener aka Docusate) 100 mg up to twice a day to help with bowel movements. These medications are over the counter.  MiraLax is an Osmotic You may use other over-the-counter medications such as Dulcolax, Fleet enemas, magnesium citrate as needed for constipation. Please note that some of these medications may cause you to have abdominal cramping which is normal. If you develop severe abdominal pain, fever, vomiting, distention of your abdomen, unable to have a bowel movement for 5 days or are not passing gas, please return to the hospital.  Return to the Emergency Department for any fever, worsening pain, blood in stool, severe abdominal pain, or any other worsening or concerning symptoms.

## 2019-11-26 ENCOUNTER — Other Ambulatory Visit: Payer: Self-pay

## 2019-11-26 ENCOUNTER — Emergency Department (HOSPITAL_BASED_OUTPATIENT_CLINIC_OR_DEPARTMENT_OTHER)
Admission: EM | Admit: 2019-11-26 | Discharge: 2019-11-27 | Disposition: A | Discharge status: Home / Self Care | Payer: BC Managed Care – PPO | Attending: Emergency Medicine | Admitting: Emergency Medicine

## 2019-11-26 ENCOUNTER — Encounter (HOSPITAL_BASED_OUTPATIENT_CLINIC_OR_DEPARTMENT_OTHER): Payer: Self-pay | Admitting: Emergency Medicine

## 2019-11-26 ENCOUNTER — Emergency Department (HOSPITAL_BASED_OUTPATIENT_CLINIC_OR_DEPARTMENT_OTHER): Payer: BC Managed Care – PPO

## 2019-11-26 DIAGNOSIS — R195 Other fecal abnormalities: Secondary | ICD-10-CM | POA: Diagnosis not present

## 2019-11-26 DIAGNOSIS — Z79899 Other long term (current) drug therapy: Secondary | ICD-10-CM | POA: Diagnosis not present

## 2019-11-26 DIAGNOSIS — K5900 Constipation, unspecified: Secondary | ICD-10-CM | POA: Diagnosis present

## 2019-11-26 LAB — PREGNANCY, URINE: Preg Test, Ur: NEGATIVE

## 2019-11-26 MED ORDER — DOCUSATE SODIUM 100 MG PO CAPS
100.0000 mg | ORAL_CAPSULE | Freq: Once | ORAL | Status: AC
  Administered 2019-11-26: 100 mg via ORAL
  Filled 2019-11-26: qty 1

## 2019-11-26 NOTE — ED Triage Notes (Signed)
States," I am still constipated and my stomach hurts" Was eval here x2 days ago for same

## 2019-11-26 NOTE — ED Notes (Signed)
PT states small hard BM today.

## 2019-11-27 ENCOUNTER — Encounter (HOSPITAL_BASED_OUTPATIENT_CLINIC_OR_DEPARTMENT_OTHER): Payer: Self-pay | Admitting: Emergency Medicine

## 2019-11-27 NOTE — ED Provider Notes (Addendum)
[place] EMERGENCY DEPARTMENT Provider Note   CSN: [id number] Arrival date & time: 11/26/19  2238     History   Chief Complaint Chief Complaint  Patient presents with  . Constipation    HPI Katrina Maldonado is a 23 y.o. female.     The history is provided by the patient.  Constipation Severity:  Moderate Time since last bowel movement:  7 days Timing:  Constant Progression:  Unchanged Chronicity:  New Context: not dehydration   Context comment:  Eats fast food daily  Stool description:  Hard and small Relieved by:  Nothing Worsened by:  Nothing Ineffective treatments:  Miralax Associated symptoms: no abdominal pain, no anorexia, no back pain, no diarrhea, no dysuria, no fever, no flatus, no hematochezia, no nausea, no urinary retention and no vomiting   Risk factors: no change in medication     Past Medical History:  Diagnosis Date  . Allergy     There are no active problems to display for this patient.   History reviewed. No pertinent surgical history.   OB History   No obstetric history on file.      Home Medications    Prior to Admission medications   Medication Sig Start Date End Date Taking? Authorizing Provider  cetirizine (ZYRTEC ALLERGY) 10 MG tablet Take 1 tablet (10 mg total) by mouth daily. 11/27/17   Sherwood Gambler, MD  diphenhydrAMINE (BENADRYL) 25 MG tablet Take 1 tablet (25 mg total) by mouth every 6 (six) hours. 07/13/19   Maudie Flakes, MD  famotidine (PEPCID) 20 MG tablet Take 1 tablet (20 mg total) by mouth 2 (two) times daily. 07/13/19   Maudie Flakes, MD  fluticasone (FLONASE) 50 MCG/ACT nasal spray Place 2 sprays into both nostrils daily. 05/04/18   Horton, Barbette Hair, MD  hydrOXYzine (ATARAX/VISTARIL) 25 MG tablet Take 0.5-1 tablets (12.5-25 mg total) by mouth every 8 (eight) hours as needed for itching. 07/13/19   Jaynee Eagles, PA-C  naproxen (NAPROSYN) 500 MG tablet Take 1 tablet (500 mg total) by mouth 2 (two) times  daily. 07/13/19   Jaynee Eagles, PA-C  polyethylene glycol (MIRALAX MIX-IN PAX) 17 g packet Take 17 g by mouth daily. 11/24/19   Albrizze, Kaitlyn E, PA-C  sodium chloride (OCEAN) 0.65 % SOLN nasal spray Place 1 spray into both nostrils as needed for congestion. 05/04/18   Horton, Barbette Hair, MD  traMADol (ULTRAM) 50 MG tablet Take 1 tablet (50 mg total) by mouth every 6 (six) hours as needed. 03/10/18   Malvin Johns, MD  triamcinolone (KENALOG) 0.025 % cream Apply 1 application topically 2 (two) times daily. 07/13/19   Jaynee Eagles, PA-C  triamcinolone (NASACORT) 55 MCG/ACT AERO nasal inhaler Place 2 sprays into the nose daily. 11/27/17   Sherwood Gambler, MD  vitamin C (ASCORBIC ACID) 500 MG tablet Take 500 mg by mouth daily.    [provider]    Family History Family History  Family history unknown: Yes    Social History Social History   Tobacco Use  . Smoking status: Never Smoker  . Smokeless tobacco: Never Used  Substance Use Topics  . Alcohol use: Yes    Comment: occ  . Drug use: No     Allergies   Patient has no known allergies.   Review of Systems Review of Systems  Constitutional: Negative for fever.  HENT: Negative for congestion.   Eyes: Negative for visual disturbance.  Respiratory: Negative for cough.   Cardiovascular: Negative  for chest pain.  Gastrointestinal: Positive for constipation. Negative for abdominal pain, anorexia, diarrhea, flatus, hematochezia, nausea and vomiting.  Genitourinary: Negative for dysuria.  Musculoskeletal: Negative for back pain.  Neurological: Negative for dizziness.  Psychiatric/Behavioral: Negative for agitation.  All other systems reviewed and are negative.    Physical Exam Updated Vital Signs BP 132/89 (BP Location: Right Arm)   Pulse 70   Temp 98.5 F (36.9 C) (Oral)   Resp 16   Ht 5\' 6"  (1.676 m)   Wt 71.3 kg   LMP 10/31/2019 Comment: (-) urine pregnancy//ac  SpO2 98%   BMI 25.37 kg/m   Physical Exam  Vitals signs and nursing note reviewed.  Constitutional:      General: She is not in acute distress.    Appearance: She is normal weight.  HENT:     Head: Normocephalic and atraumatic.     Nose: Nose normal.  Eyes:     Conjunctiva/sclera: Conjunctivae normal.     Pupils: Pupils are equal, round, and reactive to light.  Neck:     Musculoskeletal: Normal range of motion and neck supple.  Cardiovascular:     Rate and Rhythm: Normal rate and regular rhythm.     Pulses: Normal pulses.     Heart sounds: Normal heart sounds.  Pulmonary:     Effort: Pulmonary effort is normal.     Breath sounds: Normal breath sounds.  Abdominal:     General: Abdomen is flat. Bowel sounds are normal.     Tenderness: There is no abdominal tenderness. There is no guarding.  Musculoskeletal: Normal range of motion.  Skin:    General: Skin is warm and dry.     Capillary Refill: Capillary refill takes less than 2 seconds.  Neurological:     General: No focal deficit present.     Mental Status: She is alert and oriented to person, place, and time.     Deep Tendon Reflexes: Reflexes normal.  Psychiatric:        Mood and Affect: Mood normal.        Behavior: Behavior normal.      ED Treatments / Results  Labs (all labs ordered are listed, but only abnormal results are displayed) Labs Reviewed  PREGNANCY, URINE    EKG None  Radiology Results for orders placed or performed during the hospital encounter of 11/26/19  Pregnancy, urine  Result Value Ref Range   Preg Test, Ur NEGATIVE NEGATIVE   Dg Chest Port 1 View  Result Date: 11/15/2019 CLINICAL DATA:  Shortness of breath, COVID positive EXAM: PORTABLE CHEST 1 VIEW COMPARISON:  None. FINDINGS: The heart size and mediastinal contours are within normal limits. Both lungs are clear. The visualized skeletal structures are unremarkable. IMPRESSION: No active disease. Electronically Signed   By: Jasmine PangKim  Fujinaga M.D.   On: 11/15/2019 21:42   Dg Abd  Acute W/chest  Result Date: 11/27/2019 CLINICAL DATA:  Constipation and pelvic pain for 1 week EXAM: DG ABDOMEN ACUTE W/ 1V CHEST COMPARISON:  None. FINDINGS: Cardiac shadows within normal limits. The lungs are well aerated bilaterally. No focal infiltrate or sizable effusion is seen. No free air is seen. Scattered large and small bowel gas is noted. No significant retained fecal material is seen. No bony abnormality is noted. IMPRESSION: No acute abnormality noted. Electronically Signed   By: Alcide CleverMark  Lukens M.D.   On: 11/27/2019 00:30    Procedures Procedures (including critical care time)  Medications Ordered in ED Medications  docusate sodium (  COLACE) capsule 100 mg (100 mg Oral Given 11/26/19 2312)     Initial Impression / Assessment and Plan / ED Course  No signs of obstruction, lots of gas minimal stool.  Patient is not really constipated.    Stay away from fast foods increase water in your diet to 12 bottles a day. Patient should continue to take miralax daily.    Katrina Maldonado was evaluated in Emergency Department on 11/27/2019 for the symptoms described in the history of present illness. She was evaluated in the context of the global COVID-19 pandemic, which necessitated consideration that the patient might be at risk for infection with the SARS-CoV-2 virus that causes COVID-19. Institutional protocols and algorithms that pertain to the evaluation of patients at risk for COVID-19 are in a state of rapid change based on information released by regulatory bodies including the CDC and federal and state organizations. These policies and algorithms were followed during the patient's care in the ED.   Final Clinical Impressions(s) / ED Diagnoses   Return for intractable cough, coughing up blood,fevers >100.4 unrelieved by medication, shortness of breath, intractable vomiting, chest pain, shortness of breath, weakness,numbness, changes in speech, facial asymmetry,abdominal pain,  passing out,Inability to tolerate liquids or food, cough, altered mental status or any concerns. No signs of systemic illness or infection. The patient is nontoxic-appearing on exam and vital signs are within normal limits.   I have reviewed the triage vital signs and the nursing notes. Pertinent labs &imaging results that were available during my care of the patient were reviewed by me and considered in my medical decision making (see chart for details).  After history, exam, and medical workup I feel the patient has been appropriately medically screened and is safe for discharge home. Pertinent diagnoses were discussed with the patient. Patient was given return precautions   Nason Conradt, MD 11/27/19 9767    Nicanor Alcon, Charls Custer, MD 11/27/19 3419

## 2019-11-27 NOTE — ED Notes (Signed)
Patient transported to X-ray 

## 2019-11-27 NOTE — Discharge Instructions (Addendum)
Continue miralax 

## 2020-12-12 ENCOUNTER — Emergency Department (HOSPITAL_BASED_OUTPATIENT_CLINIC_OR_DEPARTMENT_OTHER): Payer: BC Managed Care – PPO

## 2020-12-12 ENCOUNTER — Encounter (HOSPITAL_BASED_OUTPATIENT_CLINIC_OR_DEPARTMENT_OTHER): Payer: Self-pay | Admitting: *Deleted

## 2020-12-12 ENCOUNTER — Other Ambulatory Visit: Payer: Self-pay

## 2020-12-12 ENCOUNTER — Emergency Department (HOSPITAL_BASED_OUTPATIENT_CLINIC_OR_DEPARTMENT_OTHER)
Admission: EM | Admit: 2020-12-12 | Discharge: 2020-12-13 | Disposition: A | Discharge status: Home / Self Care | Payer: BC Managed Care – PPO | Attending: Emergency Medicine | Admitting: Emergency Medicine

## 2020-12-12 DIAGNOSIS — R0789 Other chest pain: Secondary | ICD-10-CM | POA: Insufficient documentation

## 2020-12-12 DIAGNOSIS — E876 Hypokalemia: Secondary | ICD-10-CM | POA: Diagnosis not present

## 2020-12-12 LAB — BASIC METABOLIC PANEL
Anion gap: 9 (ref 5–15)
BUN: 13 mg/dL (ref 6–20)
CO2: 27 mmol/L (ref 22–32)
Calcium: 9.1 mg/dL (ref 8.9–10.3)
Chloride: 100 mmol/L (ref 98–111)
Creatinine, Ser: 0.87 mg/dL (ref 0.44–1.00)
GFR, Estimated: >60 mL/min (ref >60)
Glucose, Bld: 87 mg/dL (ref 70–99)
Potassium: 3.4 mmol/L — ABNORMAL LOW (ref 3.5–5.1)
Sodium: 136 mmol/L (ref 135–145)

## 2020-12-12 LAB — CBC
HCT: 43.2 % (ref 36.0–46.0)
Hemoglobin: 14.7 g/dL (ref 12.0–15.0)
MCH: 31.6 pg (ref 26.0–34.0)
MCHC: 34 g/dL (ref 30.0–36.0)
MCV: 92.9 fL (ref 80.0–100.0)
Platelets: 308 10*3/uL (ref 150–400)
RBC: 4.65 MIL/uL (ref 3.87–5.11)
RDW: 12.7 % (ref 11.5–15.5)
WBC: 11.8 10*3/uL — ABNORMAL HIGH (ref 4.0–10.5)
nRBC: 0 % (ref 0.0–0.2)

## 2020-12-12 LAB — TROPONIN I (HIGH SENSITIVITY): Troponin I (High Sensitivity): <2 ng/L (ref <18)

## 2020-12-12 LAB — PREGNANCY, URINE: Preg Test, Ur: NEGATIVE

## 2020-12-12 MED ORDER — DICYCLOMINE HCL 10 MG PO CAPS
ORAL_CAPSULE | ORAL | Status: AC
  Administered 2020-12-12: 20 mg
  Filled 2020-12-12: qty 2

## 2020-12-12 MED ORDER — ALUM & MAG HYDROXIDE-SIMETH 200-200-20 MG/5ML PO SUSP
30.0000 mL | Freq: Once | ORAL | Status: AC
  Administered 2020-12-12: 30 mL via ORAL
  Filled 2020-12-12: qty 30

## 2020-12-12 MED ORDER — POTASSIUM CHLORIDE CRYS ER 20 MEQ PO TBCR
40.0000 meq | EXTENDED_RELEASE_TABLET | Freq: Once | ORAL | Status: AC
  Administered 2020-12-12: 40 meq via ORAL
  Filled 2020-12-12: qty 2

## 2020-12-12 MED ORDER — DICYCLOMINE HCL 20 MG PO TABS
20.0000 mg | ORAL_TABLET | Freq: Once | ORAL | Status: DC
  Filled 2020-12-12: qty 1

## 2020-12-12 NOTE — ED Provider Notes (Signed)
[facility] EMERGENCY DEPARTMENT Provider Note   CSN: [id number] Arrival date & time: 12/12/20  2120   History Chief Complaint  Patient presents with  . Chest Pain    Katrina Maldonado is a 24 y.o. female.  The history is provided by the patient.  Chest Pain She had onset at 3 PM of a pressure feeling in her mid chest without radiation.  There is no associated dyspnea, nausea, diaphoresis.  Pain is better when she lays down, worse if she stands up or bends forward.  She has not done anything to treat it.  She is not had similar symptoms before.  Symptoms are not related to exertion.  She is a non-smoker and denies history of diabetes, hypertension, hyperlipidemia.  There is no family history of premature coronary atherosclerosis.  Past Medical History:  Diagnosis Date  . Allergy     There are no problems to display for this patient.   History reviewed. No pertinent surgical history.   OB History   No obstetric history on file.     Family History  Family history unknown: Yes    Social History   Tobacco Use  . Smoking status: Never Smoker  . Smokeless tobacco: Never Used  Vaping Use  . Vaping Use: Never used  Substance Use Topics  . Alcohol use: Yes    Comment: occ  . Drug use: No    Home Medications Prior to Admission medications   Medication Sig Start Date End Date Taking? Authorizing Provider  fluticasone (FLONASE) 50 MCG/ACT nasal spray Place 2 sprays into both nostrils daily. 05/04/18  Yes Horton, Mayer Masker, MD  cetirizine (ZYRTEC ALLERGY) 10 MG tablet Take 1 tablet (10 mg total) by mouth daily. 11/27/17   Pricilla Loveless, MD  diphenhydrAMINE (BENADRYL) 25 MG tablet Take 1 tablet (25 mg total) by mouth every 6 (six) hours. 07/13/19   Sabas Sous, MD  famotidine (PEPCID) 20 MG tablet Take 1 tablet (20 mg total) by mouth 2 (two) times daily. 07/13/19   Sabas Sous, MD  hydrOXYzine (ATARAX/VISTARIL) 25 MG tablet Take 0.5-1 tablets (12.5-25 mg  total) by mouth every 8 (eight) hours as needed for itching. 07/13/19   Wallis Bamberg, PA-C  naproxen (NAPROSYN) 500 MG tablet Take 1 tablet (500 mg total) by mouth 2 (two) times daily. 07/13/19   Wallis Bamberg, PA-C  polyethylene glycol (MIRALAX MIX-IN PAX) 17 g packet Take 17 g by mouth daily. 11/24/19   Walisiewicz, Yvonna Alanis E, PA-C  sodium chloride (OCEAN) 0.65 % SOLN nasal spray Place 1 spray into both nostrils as needed for congestion. 05/04/18   Horton, Mayer Masker, MD  traMADol (ULTRAM) 50 MG tablet Take 1 tablet (50 mg total) by mouth every 6 (six) hours as needed. 03/10/18   Rolan Bucco, MD  triamcinolone (KENALOG) 0.025 % cream Apply 1 application topically 2 (two) times daily. 07/13/19   Wallis Bamberg, PA-C  triamcinolone (NASACORT) 55 MCG/ACT AERO nasal inhaler Place 2 sprays into the nose daily. 11/27/17   Pricilla Loveless, MD  vitamin C (ASCORBIC ACID) 500 MG tablet Take 500 mg by mouth daily.    [provider]    Allergies    Patient has no known allergies.  Review of Systems   Review of Systems  Cardiovascular: Positive for chest pain.  All other systems reviewed and are negative.   Physical Exam Updated Vital Signs BP (!) 137/92   Pulse 72   Temp 98.1 F (36.7 C) (Oral)  Resp 14   Ht 5\' 6"  (1.676 m)   Wt 73 kg   SpO2 100%   BMI 25.99 kg/m   Physical Exam Vitals and nursing note reviewed.   24 year old female, resting comfortably and in no acute distress. Vital signs are normal. Oxygen saturation is 100%, which is normal. Head is normocephalic and atraumatic. PERRLA, EOMI. Oropharynx is clear. Neck is nontender and supple without adenopathy or JVD. Back is nontender and there is no CVA tenderness. Lungs are clear without rales, wheezes, or rhonchi. Chest is mildly tender in the right parasternal area.  There is no Crepitus. Heart has regular rate and rhythm without murmur. Abdomen is soft, flat, nontender without masses or hepatosplenomegaly and peristalsis  is normoactive. Extremities have no cyanosis or edema, full range of motion is present. Skin is warm and dry without rash. Neurologic: Mental status is normal, cranial nerves are intact, there are no motor or sensory deficits.  ED Results / Procedures / Treatments   Labs (all labs ordered are listed, but only abnormal results are displayed) Labs Reviewed  BASIC METABOLIC PANEL - Abnormal; Notable for the following components:      Result Value   Potassium 3.4 (*)    All other components within normal limits  CBC - Abnormal; Notable for the following components:   WBC 11.8 (*)    All other components within normal limits  PREGNANCY, URINE  TROPONIN I (HIGH SENSITIVITY)  TROPONIN I (HIGH SENSITIVITY)    EKG EKG Interpretation  Date/Time:  Monday December 12 2020 21:25:23 EST Ventricular Rate:  74 PR Interval:  146 QRS Duration: 74 QT Interval:  370 QTC Calculation: 410 R Axis:   70 Text Interpretation: Normal sinus rhythm Septal infarct , age undetermined Abnormal ECG No significant change since last tracing Confirmed by 01-04-2000 [phone]) on 12/12/2020 9:38:38 PM   Radiology DG Chest 2 View  Result Date: 12/12/2020 CLINICAL DATA:  Chest pressure EXAM: CHEST - 2 VIEW COMPARISON:  11/15/2019 FINDINGS: The heart size and mediastinal contours are within normal limits. Both lungs are clear. The visualized skeletal structures are unremarkable. IMPRESSION: No active cardiopulmonary disease. Electronically Signed   By: 11/17/2019 MD   On: 12/12/2020 21:41    Procedures Procedures  Medications Ordered in ED Medications  dicyclomine (BENTYL) tablet 20 mg (20 mg Oral Not Given 12/13/20 0010)  potassium chloride SA (KLOR-CON) CR tablet 40 mEq (40 mEq Oral Given 12/12/20 2356)  alum & mag hydroxide-simeth (MAALOX/MYLANTA) 200-200-20 MG/5ML suspension 30 mL (30 mLs Oral Given 12/12/20 2357)  dicyclomine (BENTYL) 10 MG capsule (20 mg  Given 12/12/20 2354)    ED Course   I have reviewed the triage vital signs and the nursing notes.  Pertinent labs & imaging results that were available during my care of the patient were reviewed by me and considered in my medical decision making (see chart for details).  MDM Rules/Calculators/A&P Nonspecific chest pain, etiology unclear.  Heart pathway risk score is 1 which puts her at low risk for major adverse cardiac events in the next 6 weeks.  She is on birth control pills but no other risk factors for pulmonary embolism and symptoms are quite atypical and vital signs completely normal.  ECG shows no acute changes and chest x-ray is normal.  Initial troponin is normal.  Labs are significant for borderline hypokalemia and she is given a dose of oral potassium.  Old records are reviewed, and she has no relevant past visits.  She will be kept for delta troponin and will give therapeutic trial of a GI cocktail.  She had excellent relief of symptoms with above-noted treatment.  Repeat troponin is normal.  She is discharged with prescription for dicyclomine.  Final Clinical Impression(s) / ED Diagnoses Final diagnoses:  Atypical chest pain  Hypokalemia    Rx / DC Orders ED Discharge Orders         Ordered    dicyclomine (BENTYL) 20 MG tablet  4 times daily PRN        12/13/20 0059           Dione Booze, MD 12/13/20 0100

## 2020-12-12 NOTE — ED Triage Notes (Signed)
Pressure in the center of her chest this afternoon while at work walking.

## 2020-12-13 LAB — TROPONIN I (HIGH SENSITIVITY): Troponin I (High Sensitivity): <2 ng/L (ref <18)

## 2020-12-13 MED ORDER — DICYCLOMINE HCL 20 MG PO TABS: 20.0000 mg | ORAL_TABLET | Freq: Four times a day (QID) | ORAL | 0 refills | Status: DC | PRN

## 2020-12-13 NOTE — Discharge Instructions (Signed)
Return if you are having any problems. 

## 2021-04-26 IMAGING — DX DG CHEST 1V PORT
1 series · 1 of 1 positions shown · non-contrast
Comparison: None.

CLINICAL DATA: Shortness of breath, COVID positive

EXAM:
PORTABLE CHEST 1 VIEW

[chest ap]
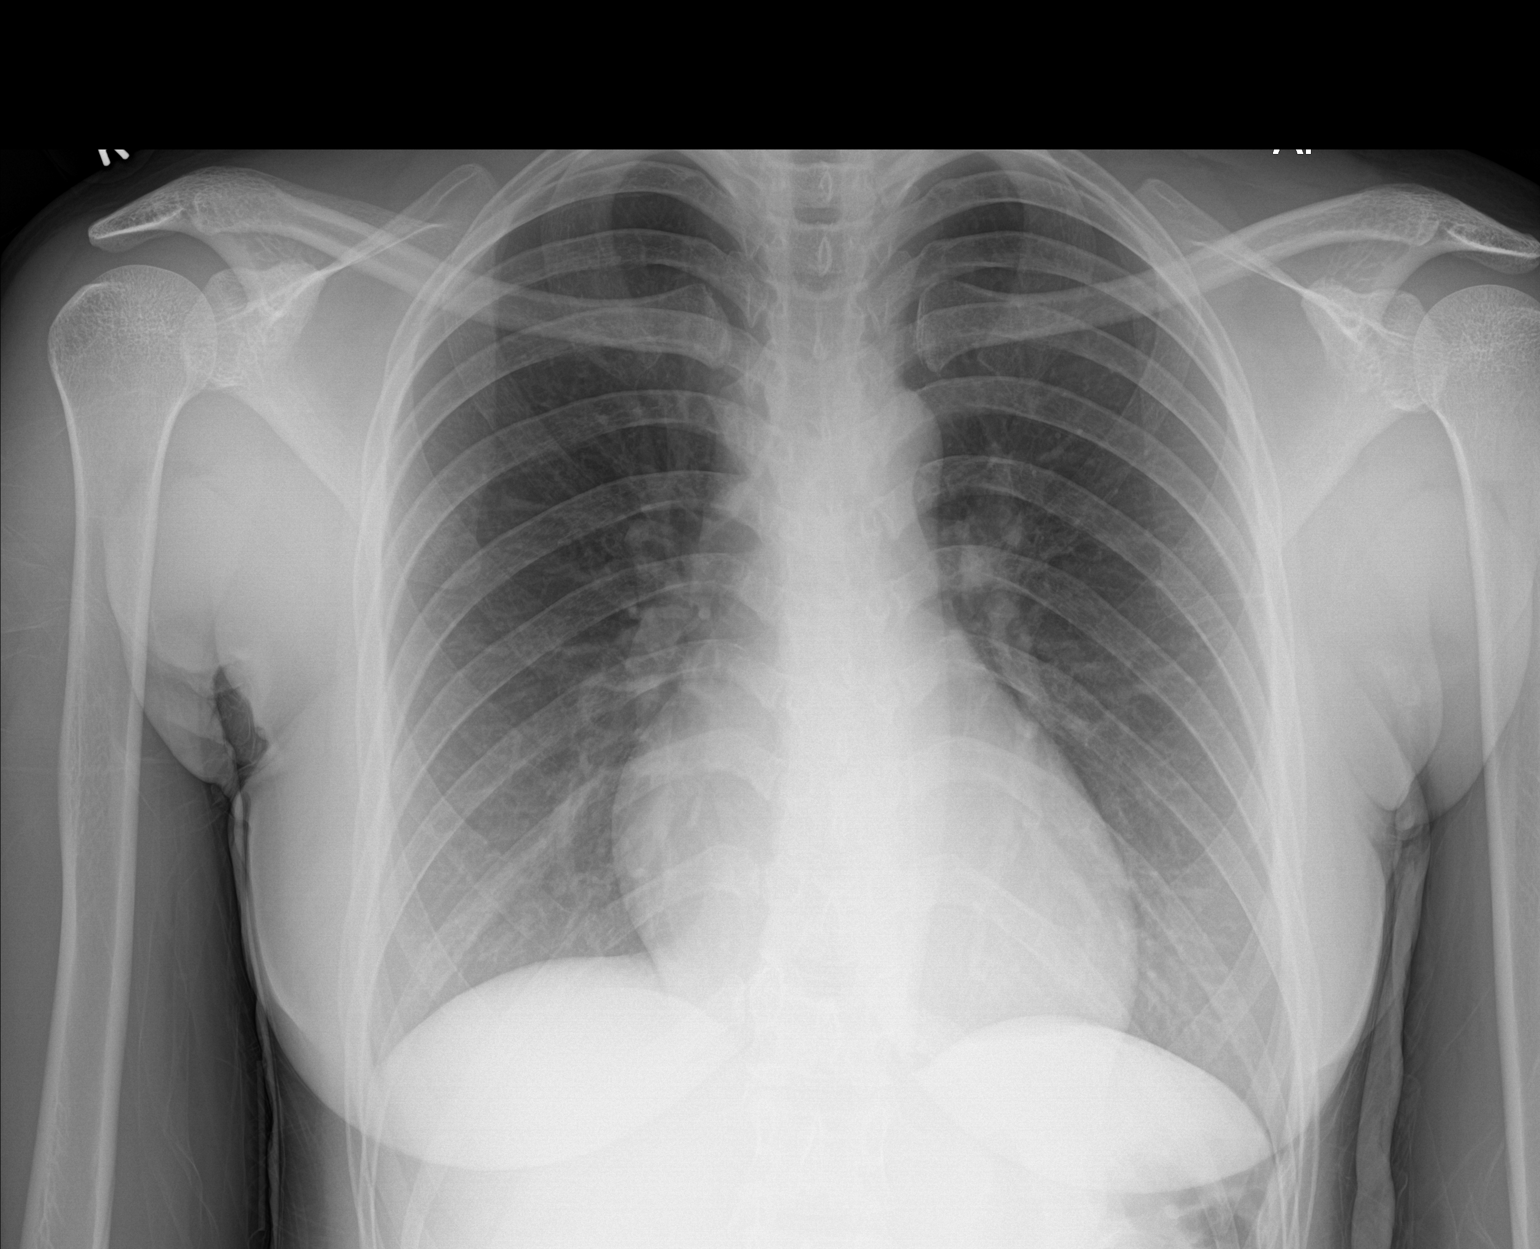

[1 of 1 positions shown; findings below may reference images not displayed]

FINDINGS: The heart size and mediastinal contours are within normal limits.
Both lungs are clear. The visualized skeletal structures are
unremarkable.
IMPRESSION: No active disease.

## 2021-06-03 ENCOUNTER — Other Ambulatory Visit: Payer: Self-pay

## 2021-06-03 ENCOUNTER — Emergency Department (HOSPITAL_BASED_OUTPATIENT_CLINIC_OR_DEPARTMENT_OTHER)
Admission: EM | Admit: 2021-06-03 | Discharge: 2021-06-04 | Disposition: A | Discharge status: Home / Self Care | Payer: BC Managed Care – PPO | Attending: Emergency Medicine | Admitting: Emergency Medicine

## 2021-06-03 ENCOUNTER — Encounter (HOSPITAL_BASED_OUTPATIENT_CLINIC_OR_DEPARTMENT_OTHER): Payer: Self-pay | Admitting: *Deleted

## 2021-06-03 DIAGNOSIS — N72 Inflammatory disease of cervix uteri: Secondary | ICD-10-CM | POA: Insufficient documentation

## 2021-06-03 DIAGNOSIS — B9689 Other specified bacterial agents as the cause of diseases classified elsewhere: Secondary | ICD-10-CM | POA: Diagnosis not present

## 2021-06-03 DIAGNOSIS — R21 Rash and other nonspecific skin eruption: Secondary | ICD-10-CM | POA: Diagnosis present

## 2021-06-03 LAB — URINALYSIS, ROUTINE W REFLEX MICROSCOPIC
Bilirubin Urine: NEGATIVE
Glucose, UA: NEGATIVE mg/dL
Ketones, ur: 40 mg/dL — AB
Nitrite: NEGATIVE
Protein, ur: NEGATIVE mg/dL
Specific Gravity, Urine: >1.03 — ABNORMAL HIGH (ref 1.005–1.030)
pH: 5.5 (ref 5.0–8.0)

## 2021-06-03 LAB — PREGNANCY, URINE: Preg Test, Ur: NEGATIVE

## 2021-06-03 LAB — URINALYSIS, MICROSCOPIC (REFLEX)

## 2021-06-03 NOTE — ED Triage Notes (Addendum)
Pt reports she noticed bumps on her vagina yesterday. States "it burns when I pee". Addend: When asked the suicide assessment questions pt became tearful and endorsed SI with plan for the last hour. States this is in regard to possibly having herpes. When asked if she had a plan pt states "I would crash my car" if dx with herpes

## 2021-06-03 NOTE — ED Notes (Signed)
EDP at bedside  

## 2021-06-03 NOTE — ED Notes (Signed)
Pt changed in to scrubs, wanded by security, belongings bagged. States "I'm no going to do anything here". Katrina Alanis, RN, Valorie, charge RN, and Dr. Adela Lank made aware. No sitter available. Pt in view of nurse's desk with door open awaiting MD eval

## 2021-06-03 NOTE — ED Notes (Signed)
Dr. Adela Lank at bedside with pt

## 2021-06-04 LAB — WET PREP, GENITAL
Clue Cells Wet Prep HPF POC: NONE SEEN
Sperm: NONE SEEN
Trich, Wet Prep: NONE SEEN
Yeast Wet Prep HPF POC: NONE SEEN

## 2021-06-04 LAB — HIV ANTIBODY (ROUTINE TESTING W REFLEX): HIV Screen 4th Generation wRfx: NONREACTIVE

## 2021-06-04 MED ORDER — DOXYCYCLINE HYCLATE 100 MG PO TABS
100.0000 mg | ORAL_TABLET | Freq: Once | ORAL | Status: AC
  Administered 2021-06-04: 100 mg via ORAL
  Filled 2021-06-04: qty 1

## 2021-06-04 MED ORDER — METRONIDAZOLE 500 MG PO TABS
2000.0000 mg | ORAL_TABLET | Freq: Once | ORAL | Status: AC
  Administered 2021-06-04: 2000 mg via ORAL
  Filled 2021-06-04: qty 4

## 2021-06-04 MED ORDER — DOXYCYCLINE HYCLATE 100 MG PO CAPS: 100.0000 mg | ORAL_CAPSULE | Freq: Two times a day (BID) | ORAL | 0 refills | Status: DC

## 2021-06-04 MED ORDER — CEFTRIAXONE SODIUM 500 MG IJ SOLR
500.0000 mg | Freq: Once | INTRAMUSCULAR | Status: AC
  Administered 2021-06-04: 500 mg via INTRAMUSCULAR
  Filled 2021-06-04: qty 500

## 2021-06-04 MED ORDER — LIDOCAINE HCL (PF) 1 % IJ SOLN
INTRAMUSCULAR | Status: AC
  Administered 2021-06-04: 1 mL
  Filled 2021-06-04: qty 5

## 2021-06-04 NOTE — ED Provider Notes (Signed)
[facility] EMERGENCY DEPARTMENT Provider Note   CSN: [id number] Arrival date & time: 06/03/21  2253     History Chief Complaint  Patient presents with  . Rash  . Suicidal    Katrina Maldonado is a 25 y.o. female.  25 yo F with a chief complaints of painful rash to the vaginal area.  Noticed that yesterday.  She is concerned that it may be herpes.  Has had some mild pelvic discomfort.  No fevers no abdominal pain no nausea or vomiting.  No diarrhea.  Denies likelihood of being pregnant.  Denies vaginal discharge.  The history is provided by the patient.  Rash Associated symptoms: no fever, no headaches, no joint pain, no myalgias, no nausea, no shortness of breath, not vomiting and not wheezing   Illness Severity:  Moderate Onset quality:  Gradual Duration:  1 day Timing:  Constant Progression:  Worsening Chronicity:  New Associated symptoms: rash   Associated symptoms: no chest pain, no congestion, no fever, no headaches, no myalgias, no nausea, no rhinorrhea, no shortness of breath, no vomiting and no wheezing        Past Medical History:  Diagnosis Date  . Allergy     There are no problems to display for this patient.   History reviewed. No pertinent surgical history.   OB History   No obstetric history on file.     Family History  Family history unknown: Yes    Social History   Tobacco Use  . Smoking status: Never Smoker  . Smokeless tobacco: Never Used  Vaping Use  . Vaping Use: Never used  Substance Use Topics  . Alcohol use: Yes    Comment: occ  . Drug use: No    Home Medications Prior to Admission medications   Medication Sig Start Date End Date Taking? Authorizing Provider  doxycycline (VIBRAMYCIN) 100 MG capsule Take 1 capsule (100 mg total) by mouth 2 (two) times daily. One po bid x 7 days 06/04/21  Yes Melene Plan, DO  cetirizine (ZYRTEC ALLERGY) 10 MG tablet Take 1 tablet (10 mg total) by mouth daily. 11/27/17   Pricilla Loveless,  MD  dicyclomine (BENTYL) 20 MG tablet Take 1 tablet (20 mg total) by mouth 4 (four) times daily as needed for spasms. 12/13/20   Dione Booze, MD  diphenhydrAMINE (BENADRYL) 25 MG tablet Take 1 tablet (25 mg total) by mouth every 6 (six) hours. 07/13/19   Sabas Sous, MD  famotidine (PEPCID) 20 MG tablet Take 1 tablet (20 mg total) by mouth 2 (two) times daily. 07/13/19   Sabas Sous, MD  fluticasone (FLONASE) 50 MCG/ACT nasal spray Place 2 sprays into both nostrils daily. 05/04/18   Horton, Mayer Masker, MD  hydrOXYzine (ATARAX/VISTARIL) 25 MG tablet Take 0.5-1 tablets (12.5-25 mg total) by mouth every 8 (eight) hours as needed for itching. 07/13/19   Wallis Bamberg, PA-C  naproxen (NAPROSYN) 500 MG tablet Take 1 tablet (500 mg total) by mouth 2 (two) times daily. 07/13/19   Wallis Bamberg, PA-C  polyethylene glycol (MIRALAX MIX-IN PAX) 17 g packet Take 17 g by mouth daily. 11/24/19   Walisiewicz, Yvonna Alanis E, PA-C  sodium chloride (OCEAN) 0.65 % SOLN nasal spray Place 1 spray into both nostrils as needed for congestion. 05/04/18   Horton, Mayer Masker, MD  traMADol (ULTRAM) 50 MG tablet Take 1 tablet (50 mg total) by mouth every 6 (six) hours as needed. 03/10/18   Rolan Bucco, MD  triamcinolone (KENALOG) 0.025 %  cream Apply 1 application topically 2 (two) times daily. 07/13/19   Wallis Bamberg, PA-C  triamcinolone (NASACORT) 55 MCG/ACT AERO nasal inhaler Place 2 sprays into the nose daily. 11/27/17   Pricilla Loveless, MD  vitamin C (ASCORBIC ACID) 500 MG tablet Take 500 mg by mouth daily.    [provider]    Allergies    Patient has no known allergies.  Review of Systems   Review of Systems  Constitutional: Negative for chills and fever.  HENT: Negative for congestion and rhinorrhea.   Eyes: Negative for redness and visual disturbance.  Respiratory: Negative for shortness of breath and wheezing.   Cardiovascular: Negative for chest pain and palpitations.  Gastrointestinal: Negative for nausea  and vomiting.  Genitourinary: Positive for pelvic pain (mild) and vaginal pain. Negative for difficulty urinating, dysuria, urgency, vaginal bleeding and vaginal discharge.  Musculoskeletal: Negative for arthralgias and myalgias.  Skin: Positive for rash. Negative for pallor and wound.  Neurological: Negative for dizziness and headaches.    Physical Exam Updated Vital Signs Pulse 100   Temp 98.5 F (36.9 C) (Oral)   Resp 18   Ht 5\' 7"  (1.702 m)   Wt 70.3 kg   SpO2 99%   BMI 24.28 kg/m   Physical Exam Vitals and nursing note reviewed.  Constitutional:      General: She is not in acute distress.    Appearance: She is well-developed. She is not diaphoretic.  HENT:     Head: Normocephalic and atraumatic.  Eyes:     Pupils: Pupils are equal, round, and reactive to light.  Cardiovascular:     Rate and Rhythm: Normal rate and regular rhythm.     Heart sounds: No murmur heard. No friction rub. No gallop.   Pulmonary:     Effort: Pulmonary effort is normal.     Breath sounds: No wheezing or rales.  Abdominal:     General: There is no distension.     Palpations: Abdomen is soft.     Tenderness: There is no abdominal tenderness.  Genitourinary:    Comments: Friable cervix purulent drainage at the cervix.  Cervical motion tenderness.  Patient has a couple pustules along the exterior aspect of the vagina.  No vesicular rash.  No adnexal tenderness. Musculoskeletal:        General: No tenderness.     Cervical back: Normal range of motion and neck supple.  Skin:    General: Skin is warm and dry.  Neurological:     Mental Status: She is alert and oriented to person, place, and time.  Psychiatric:        Behavior: Behavior normal.     ED Results / Procedures / Treatments   Labs (all labs ordered are listed, but only abnormal results are displayed) Labs Reviewed  WET PREP, GENITAL - Abnormal; Notable for the following components:      Result Value   WBC, Wet Prep HPF POC  MANY (*)    All other components within normal limits  URINALYSIS, ROUTINE W REFLEX MICROSCOPIC - Abnormal; Notable for the following components:   APPearance HAZY (*)    Specific Gravity, Urine >1.030 (*)    Hgb urine dipstick MODERATE (*)    Ketones, ur 40 (*)    Leukocytes,Ua SMALL (*)    All other components within normal limits  URINALYSIS, MICROSCOPIC (REFLEX) - Abnormal; Notable for the following components:   Bacteria, UA RARE (*)    All other components within normal limits  PREGNANCY, URINE  RPR  HIV ANTIBODY (ROUTINE TESTING W REFLEX)  GC/CHLAMYDIA PROBE AMP ([place]) NOT AT [facility]    EKG None  Radiology No results found.  Procedures Procedures   Medications Ordered in ED Medications  cefTRIAXone (ROCEPHIN) injection 500 mg (500 mg Intramuscular Given 06/04/21 0023)  doxycycline (VIBRA-TABS) tablet 100 mg (100 mg Oral Given 06/04/21 0025)  metroNIDAZOLE (FLAGYL) tablet 2,000 mg (2,000 mg Oral Given 06/04/21 0025)  lidocaine (PF) (XYLOCAINE) 1 % injection (1 mL  Given 06/04/21 0023)    ED Course  I have reviewed the triage vital signs and the nursing notes.  Pertinent labs & imaging results that were available during my care of the patient were reviewed by me and considered in my medical decision making (see chart for details).    MDM Rules/Calculators/A&P                          25 yo F with a chief complaints of pelvic pain and a vaginal rash.  By exam the patient has a couple of ingrown hairs likely.  She does have what appears to be PID based on exam with purulent discharge and easily friable cervix and chandelier sign.  We will treat presumptively with antibiotics.  The patient had made some vague suicidal complaints up in triage that if she had herpes she would kill herself.  I discussed with her the results of my exam she tells me that she is no longer suicidal or homicidal.  Wet prep without concerning finding though patient's exam is somewhat concerning  for trichomonas.  Will treat for trichomoniasis as well.  Discharge the patient home.  PCP or health department follow-up.  12:28 AM:  I have discussed the diagnosis/risks/treatment options with the patient and believe the pt to be eligible for discharge home to follow-up with PCP, health department. We also discussed returning to the ED immediately if new or worsening sx occur. We discussed the sx which are most concerning (e.g., sudden worsening pain, fever, inability to tolerate by mouth) that necessitate immediate return. Medications administered to the patient during their visit and any new prescriptions provided to the patient are listed below.  Medications given during this visit Medications  cefTRIAXone (ROCEPHIN) injection 500 mg (500 mg Intramuscular Given 06/04/21 0023)  doxycycline (VIBRA-TABS) tablet 100 mg (100 mg Oral Given 06/04/21 0025)  metroNIDAZOLE (FLAGYL) tablet 2,000 mg (2,000 mg Oral Given 06/04/21 0025)  lidocaine (PF) (XYLOCAINE) 1 % injection (1 mL  Given 06/04/21 0023)     The patient appears reasonably screen and/or stabilized for discharge and I doubt any other medical condition or other [facility] requiring further screening, evaluation, or treatment in the ED at this time prior to discharge.    Final Clinical Impression(s) / ED Diagnoses Final diagnoses:  Cervicitis    Rx / DC Orders ED Discharge Orders         Ordered    doxycycline (VIBRAMYCIN) 100 MG capsule  2 times daily        06/04/21 0027           Melene Plan, DO 06/04/21 6568

## 2021-06-04 NOTE — ED Notes (Signed)
Pt expressed feeling calmer than she was earlier and no longer feels any SI/HI; EDP does not feel the need to pursue further psychiatric interventions

## 2021-06-04 NOTE — Discharge Instructions (Signed)
No sexual contact until you have finished your antibiotics.  Follow up with your PCP or the health department.

## 2021-06-05 LAB — GC/CHLAMYDIA PROBE AMP (~~LOC~~) NOT AT ARMC
Chlamydia: NEGATIVE
Comment: NEGATIVE
Comment: NORMAL
Neisseria Gonorrhea: NEGATIVE

## 2021-06-05 LAB — RPR: RPR Ser Ql: NONREACTIVE

## 2021-07-17 ENCOUNTER — Emergency Department (HOSPITAL_BASED_OUTPATIENT_CLINIC_OR_DEPARTMENT_OTHER)
Admission: EM | Admit: 2021-07-17 | Discharge: 2021-07-17 | Disposition: A | Discharge status: Home / Self Care | Payer: BC Managed Care – PPO | Attending: Emergency Medicine | Admitting: Emergency Medicine

## 2021-07-17 ENCOUNTER — Other Ambulatory Visit: Payer: Self-pay

## 2021-07-17 ENCOUNTER — Other Ambulatory Visit (HOSPITAL_BASED_OUTPATIENT_CLINIC_OR_DEPARTMENT_OTHER): Payer: Self-pay

## 2021-07-17 ENCOUNTER — Encounter (HOSPITAL_BASED_OUTPATIENT_CLINIC_OR_DEPARTMENT_OTHER): Payer: Self-pay | Admitting: *Deleted

## 2021-07-17 DIAGNOSIS — W57XXXA Bitten or stung by nonvenomous insect and other nonvenomous arthropods, initial encounter: Secondary | ICD-10-CM | POA: Diagnosis not present

## 2021-07-17 DIAGNOSIS — S50862A Insect bite (nonvenomous) of left forearm, initial encounter: Secondary | ICD-10-CM | POA: Insufficient documentation

## 2021-07-17 MED ORDER — CEPHALEXIN 500 MG PO CAPS
500.0000 mg | ORAL_CAPSULE | Freq: Two times a day (BID) | ORAL | 0 refills | Status: AC
  Filled 2021-07-17: qty 14, 7d supply, fill #0

## 2021-07-17 NOTE — Discharge Instructions (Addendum)
You can take Tylenol or Ibuprofen as directed for pain. You can alternate Tylenol and Ibuprofen every 4 hours. If you take Tylenol at 1pm, then you can take Ibuprofen at 5pm. Then you can take Tylenol again at 9pm.   As we discussed, if the ear does not improve in 24 hours, start the antibiotic.  If it starts working outside the circle or getting worse, return to emergency department.  Return to emergency department for any fevers, worsening redness or swelling or any other worsening concerning symptoms.

## 2021-07-17 NOTE — ED Provider Notes (Signed)
[facility] EMERGENCY DEPARTMENT Provider Note   CSN: [id number] Arrival date & time: 07/17/21  1525     History Chief Complaint  Patient presents with   Insect Bite    Katrina Maldonado is a 25 y.o. female who presents for an area of redness, warmth noted to the anterior aspect of her left forearm.  She reports that she thinks she was bitten by an insect yesterday.  She states initially, the area was high but then this morning, the area was warm to touch, red.  She states this is happened previously and she required antibiotics.  She has not noted any fever, tongue or lip swelling.  No known allergies.  The history is provided by the patient.      Past Medical History:  Diagnosis Date   Allergy     There are no problems to display for this patient.   History reviewed. No pertinent surgical history.   OB History   No obstetric history on file.     Family History  Family history unknown: Yes    Social History   Tobacco Use   Smoking status: Never   Smokeless tobacco: Never  Vaping Use   Vaping Use: Never used  Substance Use Topics   Alcohol use: Yes    Comment: occ   Drug use: No    Home Medications Prior to Admission medications   Medication Sig Start Date End Date Taking? Authorizing Provider  cephALEXin (KEFLEX) 500 MG capsule Take 1 capsule (500 mg total) by mouth 2 (two) times daily for 7 days. 07/17/21 07/24/21 Yes Maxwell Caul, PA-C  cetirizine (ZYRTEC ALLERGY) 10 MG tablet Take 1 tablet (10 mg total) by mouth daily. 11/27/17   Pricilla Loveless, MD  dicyclomine (BENTYL) 20 MG tablet Take 1 tablet (20 mg total) by mouth 4 (four) times daily as needed for spasms. 12/13/20   Dione Booze, MD  diphenhydrAMINE (BENADRYL) 25 MG tablet Take 1 tablet (25 mg total) by mouth every 6 (six) hours. 07/13/19   Sabas Sous, MD  doxycycline (VIBRAMYCIN) 100 MG capsule Take 1 capsule (100 mg total) by mouth 2 (two) times daily. One po bid x 7 days 06/04/21    Melene Plan, DO  famotidine (PEPCID) 20 MG tablet Take 1 tablet (20 mg total) by mouth 2 (two) times daily. 07/13/19   Sabas Sous, MD  fluticasone (FLONASE) 50 MCG/ACT nasal spray Place 2 sprays into both nostrils daily. 05/04/18   Horton, Mayer Masker, MD  hydrOXYzine (ATARAX/VISTARIL) 25 MG tablet Take 0.5-1 tablets (12.5-25 mg total) by mouth every 8 (eight) hours as needed for itching. 07/13/19   Wallis Bamberg, PA-C  naproxen (NAPROSYN) 500 MG tablet Take 1 tablet (500 mg total) by mouth 2 (two) times daily. 07/13/19   Wallis Bamberg, PA-C  polyethylene glycol (MIRALAX MIX-IN PAX) 17 g packet Take 17 g by mouth daily. 11/24/19   Walisiewicz, Yvonna Alanis E, PA-C  sodium chloride (OCEAN) 0.65 % SOLN nasal spray Place 1 spray into both nostrils as needed for congestion. 05/04/18   Horton, Mayer Masker, MD  traMADol (ULTRAM) 50 MG tablet Take 1 tablet (50 mg total) by mouth every 6 (six) hours as needed. 03/10/18   Rolan Bucco, MD  triamcinolone (KENALOG) 0.025 % cream Apply 1 application topically 2 (two) times daily. 07/13/19   Wallis Bamberg, PA-C  triamcinolone (NASACORT) 55 MCG/ACT AERO nasal inhaler Place 2 sprays into the nose daily. 11/27/17   Pricilla Loveless, MD  vitamin  C (ASCORBIC ACID) 500 MG tablet Take 500 mg by mouth daily.    [provider]    Allergies    Patient has no known allergies.  Review of Systems   Review of Systems  Constitutional:  Negative for fever.  Skin:  Positive for color change.  All other systems reviewed and are negative.  Physical Exam Updated Vital Signs BP (!) 139/93 (BP Location: Right Arm)   Pulse 79   Temp 98.3 F (36.8 C) (Oral)   Resp 18   Ht 5\' 6"  (1.676 m)   Wt 68 kg   SpO2 100%   BMI 24.21 kg/m   Physical Exam Vitals and nursing note reviewed.  Constitutional:      Appearance: She is well-developed.  HENT:     Head: Normocephalic and atraumatic.  Eyes:     General: No scleral icterus.       Right eye: No discharge.        Left eye:  No discharge.     Conjunctiva/sclera: Conjunctivae normal.  Pulmonary:     Effort: Pulmonary effort is normal.  Skin:    General: Skin is warm and dry.     Comments: Area of warmth, erythema that is about 4 cm in diameter noted to the anterior aspect of left forearm.  No obvious wound.  No fluctuance.  Neurological:     Mental Status: She is alert.  Psychiatric:        Speech: Speech normal.        Behavior: Behavior normal.    ED Results / Procedures / Treatments   Labs (all labs ordered are listed, but only abnormal results are displayed) Labs Reviewed - No data to display  EKG None  Radiology No results found.  Procedures Procedures   Medications Ordered in ED Medications - No data to display  ED Course  I have reviewed the triage vital signs and the nursing notes.  Pertinent labs & imaging results that were available during my care of the patient were reviewed by me and considered in my medical decision making (see chart for details).    MDM Rules/Calculators/A&P                          25 year old female who presents for evaluation of possible insect bite noted to the left forearm.  She reports that yesterday she think she was bitten by something.  She noticed hives yesterday and then today turned into an area of warmth, redness.  She states this is happened before and she is required antibiotics.  No fevers.  No swelling of her tongue or lips.  Initially arrival, she is afebrile, toxic appearing.  Vital signs are stable.  On exam, she has an area of about 4 cm in diameter of warmth, erythema.  No obvious fluctuance.  No obvious wound.  I discussed whether this could be a localized reaction.  Patient is concerned about possible infectious process.  Area was marked with a skin marker.  I discussed with patient if she does not improve in 24 hours, she can start antibiotics. Patient with no known drug allergies. At this time, patient exhibits no emergent life-threatening  condition that require further evaluation in ED. Patient had ample opportunity for questions and discussion. All patient's questions were answered with full understanding. Strict return precautions discussed. Patient expresses understanding and agreement to plan.   Portions of this note were generated with 25. Dictation  errors may occur despite best attempts at proofreading.   Final Clinical Impression(s) / ED Diagnoses Final diagnoses:  Insect bite of left forearm, initial encounter    Rx / DC Orders ED Discharge Orders          Ordered    cephALEXin (KEFLEX) 500 MG capsule  2 times daily        07/17/21 1642             Rosana Hoes 07/17/21 1749    Cathren Laine, MD 07/19/21 1024

## 2021-07-17 NOTE — ED Triage Notes (Signed)
C/o insect bite to left forearm x 2 days

## 2021-10-15 ENCOUNTER — Encounter (HOSPITAL_BASED_OUTPATIENT_CLINIC_OR_DEPARTMENT_OTHER): Payer: Self-pay | Admitting: Emergency Medicine

## 2021-10-15 ENCOUNTER — Other Ambulatory Visit: Payer: Self-pay

## 2021-10-15 DIAGNOSIS — A599 Trichomoniasis, unspecified: Secondary | ICD-10-CM | POA: Insufficient documentation

## 2021-10-15 LAB — URINALYSIS, MICROSCOPIC (REFLEX)

## 2021-10-15 LAB — URINALYSIS, ROUTINE W REFLEX MICROSCOPIC
Bilirubin Urine: NEGATIVE
Glucose, UA: NEGATIVE mg/dL
Ketones, ur: NEGATIVE mg/dL
Nitrite: NEGATIVE
Protein, ur: NEGATIVE mg/dL
Specific Gravity, Urine: 1.025 (ref 1.005–1.030)
pH: 5.5 (ref 5.0–8.0)

## 2021-10-15 LAB — PREGNANCY, URINE: Preg Test, Ur: NEGATIVE

## 2021-10-15 NOTE — ED Triage Notes (Signed)
Reports pelvic pain with vaginal discharge for the last two weeks.

## 2021-10-16 ENCOUNTER — Emergency Department (HOSPITAL_BASED_OUTPATIENT_CLINIC_OR_DEPARTMENT_OTHER)
Admission: EM | Admit: 2021-10-16 | Discharge: 2021-10-16 | Disposition: A | Discharge status: Home / Self Care | Payer: BC Managed Care – PPO | Attending: Emergency Medicine | Admitting: Emergency Medicine

## 2021-10-16 DIAGNOSIS — A599 Trichomoniasis, unspecified: Secondary | ICD-10-CM

## 2021-10-16 LAB — WET PREP, GENITAL
Clue Cells Wet Prep HPF POC: NONE SEEN
Sperm: NONE SEEN
Yeast Wet Prep HPF POC: NONE SEEN

## 2021-10-16 MED ORDER — METRONIDAZOLE 500 MG PO TABS
2000.0000 mg | ORAL_TABLET | Freq: Once | ORAL | Status: AC
  Administered 2021-10-16: 2000 mg via ORAL
  Filled 2021-10-16: qty 4

## 2021-10-16 NOTE — ED Notes (Signed)
Patient verbalizes understanding of discharge instructions. Opportunity for questioning and answers were provided. Armband removed by staff, pt discharged from ED. Ambulated out to lobby  

## 2021-10-16 NOTE — ED Provider Notes (Signed)
[facility] Provider Note: [name], MD, FACEP  CSN: [id number] MRN: [id number] ARRIVAL: 10/15/21 at 2156 ROOM: [facility]06/[facility]   CHIEF COMPLAINT  Pelvic Pain and Vaginal Discharge   HISTORY OF PRESENT ILLNESS  10/16/21 1:26 AM Katrina Maldonado is a 25 y.o. female with pelvic discomfort since the beginning of this month.  She cannot characterize the discomfort other than to say it is a sensation she has never felt before.  She feels it in her vagina.  It has been associated with yellowed vaginal discharge.  She has not noticed an abnormal odor.  She rates her discomfort as a 6 out of 10, worse with movement.  She denies dysuria, vaginal bleeding, nausea, vomiting or diarrhea.   Past Medical History:  Diagnosis Date   Allergy     History reviewed. No pertinent surgical history.  Family History  Family history unknown: Yes    Social History   Tobacco Use   Smoking status: Never   Smokeless tobacco: Never  Vaping Use   Vaping Use: Never used  Substance Use Topics   Alcohol use: Yes    Comment: occ   Drug use: No    Prior to Admission medications   Medication Sig Start Date End Date Taking? Authorizing Provider  hydrOXYzine (ATARAX/VISTARIL) 25 MG tablet Take 0.5-1 tablets (12.5-25 mg total) by mouth every 8 (eight) hours as needed for itching. 07/13/19   Wallis Bamberg, PA-C  polyethylene glycol (MIRALAX MIX-IN PAX) 17 g packet Take 17 g by mouth daily. 11/24/19   Walisiewicz, Yvonna Alanis E, PA-C  vitamin C (ASCORBIC ACID) 500 MG tablet Take 500 mg by mouth daily.    [provider]    Allergies Patient has no known allergies.   REVIEW OF SYSTEMS  Negative except as noted here or in the History of Present Illness.   PHYSICAL EXAMINATION  Initial Vital Signs Blood pressure 127/88, pulse 73, temperature 98.3 F (36.8 C), temperature source Oral, resp. rate 16, weight 68 kg, SpO2 100 %.  Examination General: Well-developed, well-nourished female  in no acute distress; appearance consistent with age of record HENT: normocephalic; atraumatic Eyes: Normal appearance Neck: supple Heart: regular rate and rhythm Lungs: clear to auscultation bilaterally Abdomen: soft; nondistended; suprapubic tenderness; bowel sounds present GU: Profuse greenish-yellow vulvovaginal discharge; no cervical motion tenderness; no adnexal tenderness Extremities: No deformity; full range of motion; pulses normal Neurologic: Awake, alert and oriented; motor function intact in all extremities and symmetric; no facial droop Skin: Warm and dry Psychiatric: Normal mood and affect   RESULTS  Summary of this visit's results, reviewed and interpreted by myself:   EKG Interpretation  Date/Time:    Ventricular Rate:    PR Interval:    QRS Duration:   QT Interval:    QTC Calculation:   R Axis:     Text Interpretation:         Laboratory Studies: Results for orders placed or performed during the hospital encounter of 10/16/21 (from the past 24 hour(s))  Pregnancy, urine     Status: None   Collection Time: 10/15/21 10:06 PM  Result Value Ref Range   Preg Test, Ur NEGATIVE NEGATIVE  Urinalysis, Routine w reflex microscopic Urine, Clean Catch     Status: Abnormal   Collection Time: 10/15/21 10:06 PM  Result Value Ref Range   Color, Urine YELLOW YELLOW   APPearance CLEAR CLEAR   Specific Gravity, Urine 1.025 1.005 - 1.030   pH 5.5 5.0 - 8.0  Glucose, UA NEGATIVE NEGATIVE mg/dL   Hgb urine dipstick TRACE (A) NEGATIVE   Bilirubin Urine NEGATIVE NEGATIVE   Ketones, ur NEGATIVE NEGATIVE mg/dL   Protein, ur NEGATIVE NEGATIVE mg/dL   Nitrite NEGATIVE NEGATIVE   Leukocytes,Ua MODERATE (A) NEGATIVE  Urinalysis, Microscopic (reflex)     Status: Abnormal   Collection Time: 10/15/21 10:06 PM  Result Value Ref Range   RBC / HPF 0-5 0 - 5 RBC/hpf   WBC, UA 11-20 0 - 5 WBC/hpf   Bacteria, UA FEW (A) NONE SEEN   Squamous Epithelial / LPF 0-5 0 - 5   Mucus  PRESENT   Wet prep, genital     Status: Abnormal   Collection Time: 10/16/21  1:30 AM   Specimen: Genital  Result Value Ref Range   Yeast Wet Prep HPF POC NONE SEEN NONE SEEN   Trich, Wet Prep PRESENT (A) NONE SEEN   Clue Cells Wet Prep HPF POC NONE SEEN NONE SEEN   WBC, Wet Prep HPF POC MANY (A) NONE SEEN   Sperm NONE SEEN    Imaging Studies: No results found.  ED COURSE and MDM  Nursing notes, initial and subsequent vitals signs, including pulse oximetry, reviewed and interpreted by myself.  Vitals:   10/15/21 2204 10/15/21 2205 10/16/21 0027  BP:  (!) 137/93 127/88  Pulse:  89 73  Resp:  18 16  Temp:  98.3 F (36.8 C)   TempSrc:  Oral   SpO2:  99% 100%  Weight: 68 kg     Medications  metroNIDAZOLE (FLAGYL) tablet 2,000 mg (has no administration in time range)    Physical examination and wet prep are consistent with trichomoniasis.  Gonorrhea and Chlamydia testing pending.  PROCEDURES  Procedures   ED DIAGNOSES     ICD-10-CM   1. Trichimoniasis  A59.9          Camille Thau, MD 10/16/21 0200

## 2021-10-17 LAB — GC/CHLAMYDIA PROBE AMP (~~LOC~~) NOT AT ARMC
Chlamydia: NEGATIVE
Comment: NEGATIVE
Comment: NORMAL
Neisseria Gonorrhea: NEGATIVE

## 2021-11-01 ENCOUNTER — Other Ambulatory Visit: Payer: Self-pay

## 2021-11-01 ENCOUNTER — Emergency Department (HOSPITAL_BASED_OUTPATIENT_CLINIC_OR_DEPARTMENT_OTHER)
Admission: EM | Admit: 2021-11-01 | Discharge: 2021-11-01 | Disposition: A | Discharge status: Home / Self Care | Payer: Self-pay | Attending: Emergency Medicine | Admitting: Emergency Medicine

## 2021-11-01 ENCOUNTER — Encounter (HOSPITAL_BASED_OUTPATIENT_CLINIC_OR_DEPARTMENT_OTHER): Payer: Self-pay | Admitting: Emergency Medicine

## 2021-11-01 DIAGNOSIS — R000 Tachycardia, unspecified: Secondary | ICD-10-CM | POA: Insufficient documentation

## 2021-11-01 DIAGNOSIS — J101 Influenza due to other identified influenza virus with other respiratory manifestations: Secondary | ICD-10-CM | POA: Insufficient documentation

## 2021-11-01 DIAGNOSIS — Z20822 Contact with and (suspected) exposure to covid-19: Secondary | ICD-10-CM | POA: Insufficient documentation

## 2021-11-01 LAB — RESP PANEL BY RT-PCR (FLU A&B, COVID) ARPGX2
Influenza A by PCR: POSITIVE — AB
Influenza B by PCR: NEGATIVE
SARS Coronavirus 2 by RT PCR: NEGATIVE

## 2021-11-01 MED ORDER — ACETAMINOPHEN 325 MG PO TABS
650.0000 mg | ORAL_TABLET | Freq: Once | ORAL | Status: AC
  Administered 2021-11-01: 650 mg via ORAL
  Filled 2021-11-01: qty 2

## 2021-11-01 MED ORDER — OSELTAMIVIR PHOSPHATE 75 MG PO CAPS
75.0000 mg | ORAL_CAPSULE | Freq: Once | ORAL | Status: AC
  Administered 2021-11-01: 75 mg via ORAL
  Filled 2021-11-01: qty 1

## 2021-11-01 MED ORDER — IBUPROFEN 400 MG PO TABS
400.0000 mg | ORAL_TABLET | Freq: Once | ORAL | Status: AC
  Administered 2021-11-01: 400 mg via ORAL
  Filled 2021-11-01: qty 1

## 2021-11-01 MED ORDER — OSELTAMIVIR PHOSPHATE 75 MG PO CAPS: 75.0000 mg | ORAL_CAPSULE | Freq: Two times a day (BID) | ORAL | 0 refills | Status: AC

## 2021-11-01 NOTE — ED Provider Notes (Signed)
[facility] Provider Note: Katrina Dell, MD, FACEP  CSN: [id number] MRN: [id number] ARRIVAL: 11/01/21 at 0419 ROOM: [facility]   CHIEF COMPLAINT  Fever   HISTORY OF PRESENT ILLNESS  11/01/21 4:32 AM Katrina Maldonado is a 25 y.o. female 1 day of flulike symptoms.  Specifically she has had nasal congestion, sore throat, cough, headache and fever.  She has been taking Tylenol for her symptoms, last dose about 9 PM yesterday evening.  Her temperature on arrival was 102.9 and she was given another dose of Tylenol.  She rates her headache as an 8 out of 10.  She has had no vomiting or diarrhea.   Past Medical History:  Diagnosis Date   Allergy     History reviewed. No pertinent surgical history.  Family History  Family history unknown: Yes    Social History   Tobacco Use   Smoking status: Never   Smokeless tobacco: Never  Vaping Use   Vaping Use: Never used  Substance Use Topics   Alcohol use: Yes    Comment: occ   Drug use: No    Prior to Admission medications   Medication Sig Start Date End Date Taking? Authorizing Provider  oseltamivir (TAMIFLU) 75 MG capsule Take 1 capsule (75 mg total) by mouth every 12 (twelve) hours. 11/01/21  Yes Kingslee Mairena, MD  hydrOXYzine (ATARAX/VISTARIL) 25 MG tablet Take 0.5-1 tablets (12.5-25 mg total) by mouth every 8 (eight) hours as needed for itching. 07/13/19   Wallis Bamberg, PA-C  polyethylene glycol (MIRALAX MIX-IN PAX) 17 g packet Take 17 g by mouth daily. 11/24/19   Walisiewicz, Yvonna Alanis E, PA-C  vitamin C (ASCORBIC ACID) 500 MG tablet Take 500 mg by mouth daily.    [provider]    Allergies Patient has no known allergies.   REVIEW OF SYSTEMS  Negative except as noted here or in the History of Present Illness.   PHYSICAL EXAMINATION  Initial Vital Signs Blood pressure (!) 149/84, pulse (!) 117, temperature (!) 102.9 F (39.4 C), temperature source Oral, resp. rate 16, height 5\' 6"  (1.676 m), weight  68.5 kg, SpO2 99 %.  Examination General: Well-developed, well-nourished female in no acute distress; appearance consistent with age of record HENT: normocephalic; atraumatic; nasal congestion Eyes: Normal appearance Neck: supple Heart: regular rate and rhythm; tachycardia Lungs: clear to auscultation bilaterally Abdomen: soft; nondistended; nontender; bowel sounds present Extremities: No deformity; full range of motion Neurologic: Awake, alert and oriented; motor function intact in all extremities and symmetric; no facial droop Skin: Warm and dry Psychiatric: Normal mood and affect   RESULTS  Summary of this visit's results, reviewed and interpreted by myself:   EKG Interpretation  Date/Time:    Ventricular Rate:    PR Interval:    QRS Duration:   QT Interval:    QTC Calculation:   R Axis:     Text Interpretation:         Laboratory Studies: Results for orders placed or performed during the hospital encounter of 11/01/21 (from the past 24 hour(s))  Resp Panel by RT-PCR (Flu A&B, Covid) Nasopharyngeal Swab     Status: Abnormal   Collection Time: 11/01/21  4:36 AM   Specimen: Nasopharyngeal Swab; Nasopharyngeal(NP) swabs in vial transport medium  Result Value Ref Range   SARS Coronavirus 2 by RT PCR NEGATIVE NEGATIVE   Influenza A by PCR POSITIVE (A) NEGATIVE   Influenza B by PCR NEGATIVE NEGATIVE   Imaging Studies: No results found.  ED COURSE and MDM  Nursing notes, initial and subsequent vitals signs, including pulse oximetry, reviewed and interpreted by myself.  Vitals:   11/01/21 0430  BP: (!) 149/84  Pulse: (!) 117  Resp: 16  Temp: (!) 102.9 F (39.4 C)  TempSrc: Oral  SpO2: 99%  Weight: 68.5 kg  Height: 5\' 6"  (1.676 m)   Medications  oseltamivir (TAMIFLU) capsule 75 mg (has no administration in time range)  acetaminophen (TYLENOL) tablet 650 mg (650 mg Oral Given 11/01/21 0437)  ibuprofen (ADVIL) tablet 400 mg (400 mg Oral Given 11/01/21 0440)    Patient positive for influenza A.  Will start on Tamiflu.   PROCEDURES  Procedures   ED DIAGNOSES     ICD-10-CM   1. Influenza A  J10.1          Katrina Grindle, MD 11/01/21 [phone]

## 2021-11-01 NOTE — ED Triage Notes (Signed)
Pt with cough, headache, fever x 24 hrs. Last tylenol at 2100

## 2021-11-13 ENCOUNTER — Other Ambulatory Visit (HOSPITAL_COMMUNITY)
Admission: RE | Admit: 2021-11-13 | Discharge: 2021-11-13 | Disposition: A | Discharge status: Home / Self Care | Payer: Self-pay | Source: Ambulatory Visit | Attending: Family Medicine | Admitting: Family Medicine

## 2021-11-13 ENCOUNTER — Other Ambulatory Visit: Payer: Self-pay

## 2021-11-13 ENCOUNTER — Emergency Department (INDEPENDENT_AMBULATORY_CARE_PROVIDER_SITE_OTHER)
Admission: EM | Admit: 2021-11-13 | Discharge: 2021-11-13 | Disposition: A | Discharge status: Home / Self Care | Payer: Self-pay | Source: Home / Self Care

## 2021-11-13 ENCOUNTER — Ambulatory Visit: Admission: EM | Admit: 2021-11-13 | Discharge: 2021-11-13 | Discharge status: Against Medical Advice | Payer: Self-pay

## 2021-11-13 DIAGNOSIS — Z202 Contact with and (suspected) exposure to infections with a predominantly sexual mode of transmission: Secondary | ICD-10-CM

## 2021-11-13 NOTE — ED Provider Notes (Signed)
[facility]    CSN: [id number] Arrival date & time: 11/13/21  1825      History   Chief Complaint   HPI Katrina Maldonado is a 25 y.o. female.   HPI 25 year old female presents for STD testing.  Patient was treated 1 month ago for trichomonas (10/16/21).  Patient reports symptoms (bilateral suprapubic pressure) has not completely resolved.  Past Medical History:  Diagnosis Date   Allergy     There are no problems to display for this patient.   History reviewed. No pertinent surgical history.  OB History   No obstetric history on file.      Home Medications    Prior to Admission medications   Medication Sig Start Date End Date Taking? Authorizing Provider  hydrOXYzine (ATARAX/VISTARIL) 25 MG tablet Take 0.5-1 tablets (12.5-25 mg total) by mouth every 8 (eight) hours as needed for itching. 07/13/19   Wallis Bamberg, PA-C  oseltamivir (TAMIFLU) 75 MG capsule Take 1 capsule (75 mg total) by mouth every 12 (twelve) hours. 11/01/21   Molpus, John, MD  polyethylene glycol (MIRALAX MIX-IN PAX) 17 g packet Take 17 g by mouth daily. 11/24/19   Walisiewicz, Yvonna Alanis E, PA-C  vitamin C (ASCORBIC ACID) 500 MG tablet Take 500 mg by mouth daily.    [provider]    Family History Family History  Family history unknown: Yes    Social History Social History   Tobacco Use   Smoking status: Never   Smokeless tobacco: Never  Vaping Use   Vaping Use: Never used  Substance Use Topics   Alcohol use: Yes    Comment: occ   Drug use: No     Allergies   Patient has no known allergies.   Review of Systems Review of Systems  All other systems reviewed and are negative.   Physical Exam Triage Vital Signs ED Triage Vitals  Enc Vitals Group     BP 11/13/21 1849 (!) 144/91     Pulse Rate 11/13/21 1849 78     Resp 11/13/21 1849 16     Temp 11/13/21 1849 98.9 F (37.2 C)     Temp Source 11/13/21 1849 Oral     SpO2 11/13/21 1849 99 %     Weight --       Height --      Head Circumference --      Peak Flow --      Pain Score 11/13/21 1850 1     Pain Loc --      Pain Edu? --      Excl. in GC? --    No data found.  Updated Vital Signs BP (!) 144/91 (BP Location: Left Arm)   Pulse 78   Temp 98.9 F (37.2 C) (Oral)   Resp 16   SpO2 99%     Physical Exam Vitals and nursing note reviewed.  Constitutional:      Appearance: Normal appearance. She is normal weight.  HENT:     Head: Normocephalic and atraumatic.     Mouth/Throat:     Mouth: Mucous membranes are moist.     Pharynx: Oropharynx is clear.  Eyes:     Extraocular Movements: Extraocular movements intact.     Conjunctiva/sclera: Conjunctivae normal.     Pupils: Pupils are equal, round, and reactive to light.  Cardiovascular:     Rate and Rhythm: Normal rate and regular rhythm.     Pulses: Normal pulses.     Heart sounds:  Normal heart sounds.  Pulmonary:     Effort: Pulmonary effort is normal.     Breath sounds: Normal breath sounds.  Musculoskeletal:        General: Normal range of motion.     Cervical back: Normal range of motion and neck supple.  Skin:    General: Skin is warm and dry.  Neurological:     General: No focal deficit present.     Mental Status: She is alert and oriented to person, place, and time.     UC Treatments / Results  Labs (all labs ordered are listed, but only abnormal results are displayed) Labs Reviewed  CERVICOVAGINAL ANCILLARY ONLY    EKG   Radiology No results found.  Procedures Procedures (including critical care time)  Medications Ordered in UC Medications - No data to display  Initial Impression / Assessment and Plan / UC Course  I have reviewed the triage vital signs and the nursing notes.  Pertinent labs & imaging results that were available during my care of the patient were reviewed by me and considered in my medical decision making (see chart for details).     MDM: 1.  Possible exposure to STD-Aptima swab  ordered. Advised patient we will follow-up with her once Aptima swab results are received.  Patient discharged home, hemodynamically stable. Final Clinical Impressions(s) / UC Diagnoses   Final diagnoses:  Possible exposure to STD     Discharge Instructions      Advised patient we will follow-up with her once abdomen swab results are received.     ED Prescriptions   None    PDMP not reviewed this encounter.   Trevor Iha, FNP 11/13/21 [phone]

## 2021-11-13 NOTE — Discharge Instructions (Addendum)
Advised patient we will follow-up with her once abdomen swab results are received.

## 2021-11-13 NOTE — ED Triage Notes (Signed)
Pt here today for STD testing. Tx a month ago for trichomonas. Feels like it hasn't completely resolved. Would like re testing.

## 2021-11-15 ENCOUNTER — Telehealth: Payer: Self-pay | Admitting: Emergency Medicine

## 2021-11-15 LAB — CERVICOVAGINAL ANCILLARY ONLY
Bacterial Vaginitis (gardnerella): POSITIVE — AB
Candida Glabrata: NEGATIVE
Candida Vaginitis: NEGATIVE
Chlamydia: NEGATIVE
Comment: NEGATIVE
Comment: NEGATIVE
Comment: NEGATIVE
Comment: NEGATIVE
Comment: NEGATIVE
Comment: NORMAL
Neisseria Gonorrhea: NEGATIVE
Trichomonas: NEGATIVE

## 2021-11-15 NOTE — Telephone Encounter (Signed)
Return call to Sham regarding test results - no answer - unable to leave a message - voice mail full. Call back nurse should contact her tomorrow regarding STI results

## 2021-11-16 ENCOUNTER — Telehealth (HOSPITAL_COMMUNITY): Payer: Self-pay | Admitting: Emergency Medicine

## 2021-11-16 MED ORDER — METRONIDAZOLE 0.75 % VA GEL: 1.0000 | Freq: Every day | VAGINAL | 0 refills | Status: AC

## 2022-05-24 IMAGING — CR DG CHEST 2V
2 series · 2 of 2 positions shown · non-contrast
Comparison: 11/15/2019

CLINICAL DATA: Chest pressure

EXAM:
CHEST - 2 VIEW

[w chest pa]
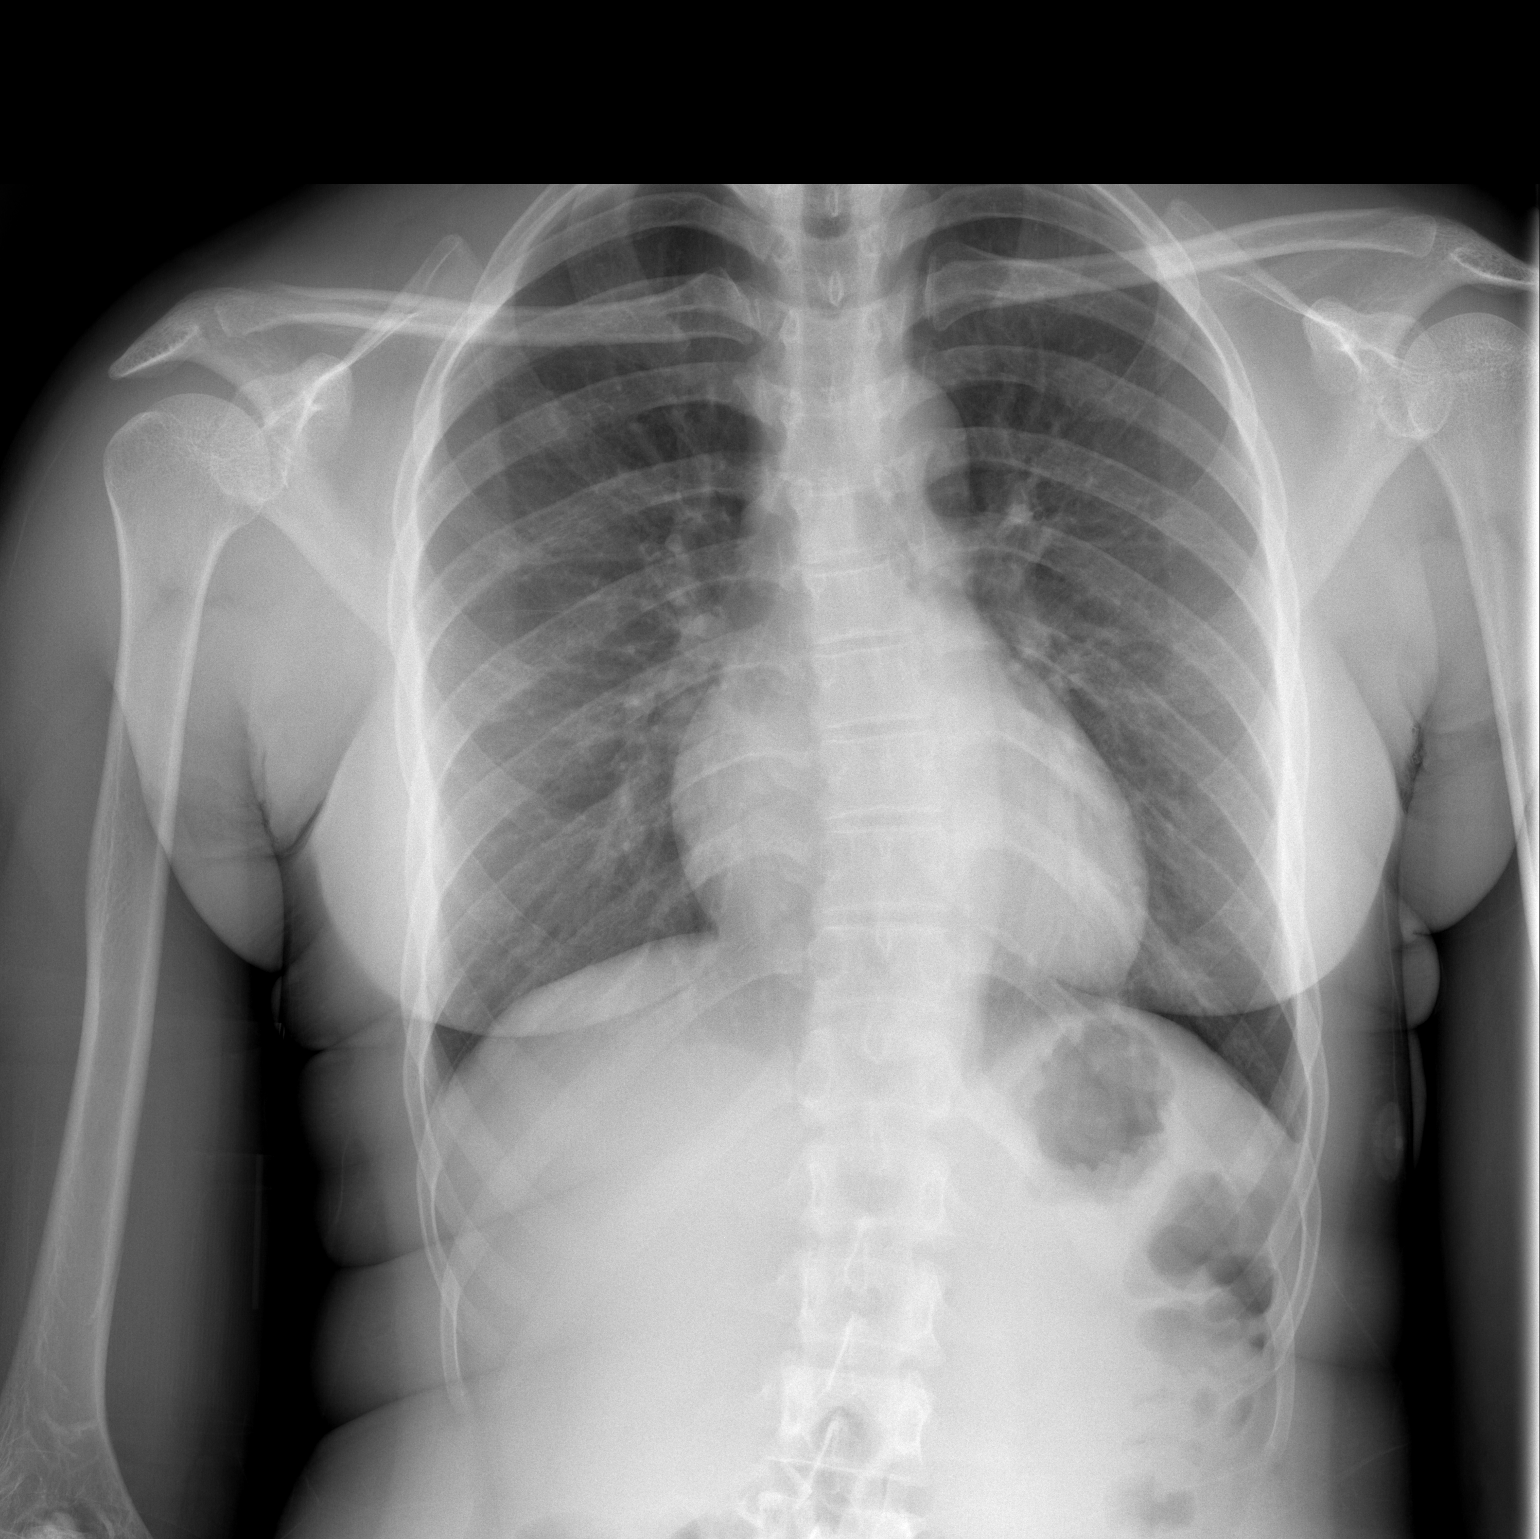

[w chest lat]
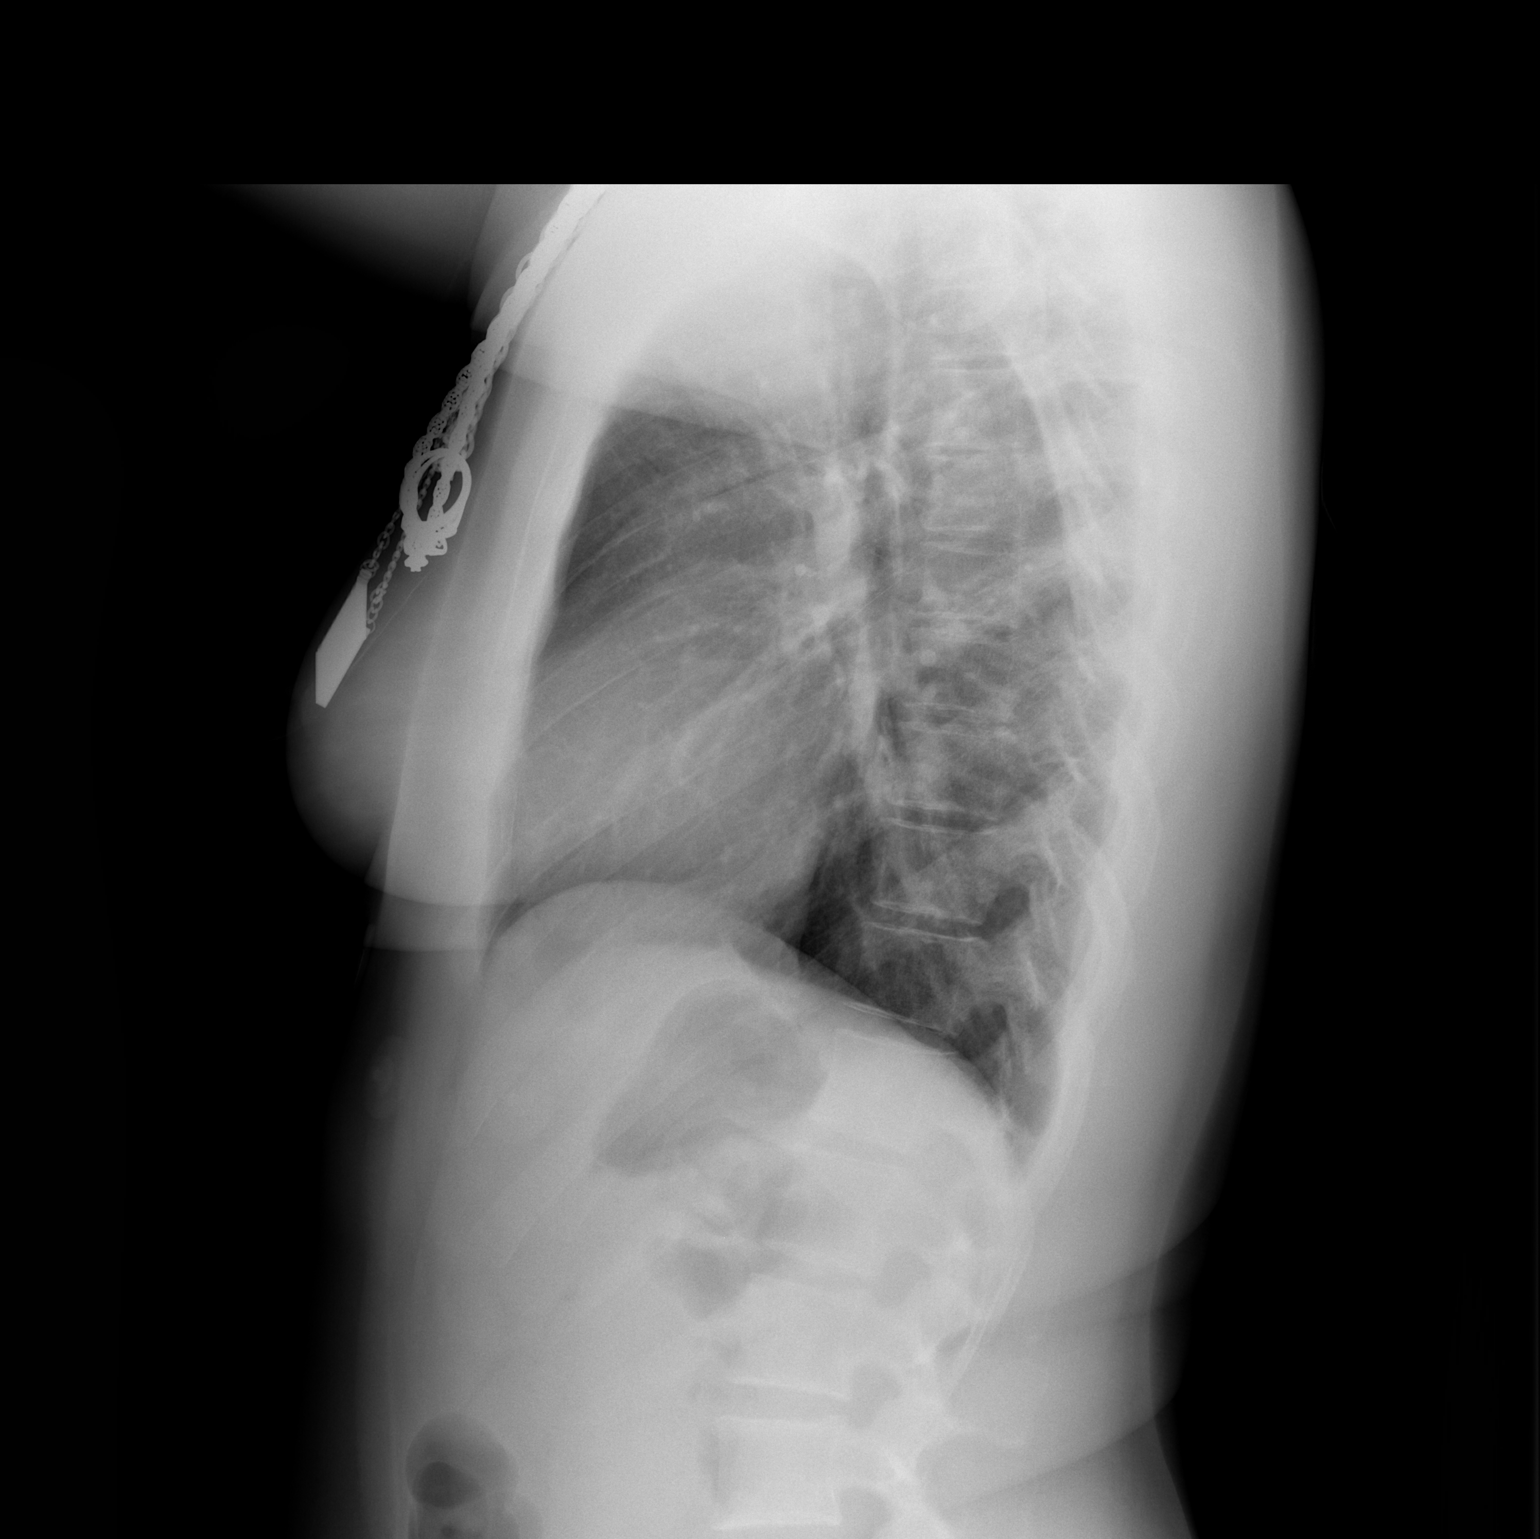

[2 of 2 positions shown; findings below may reference images not displayed]

FINDINGS: The heart size and mediastinal contours are within normal limits.
Both lungs are clear. The visualized skeletal structures are
unremarkable.
IMPRESSION: No active cardiopulmonary disease.
